# Patient Record
Sex: Male | Born: 1960
Health system: Southern US, Community
[De-identification: ages and names within clinical notes are randomized; demographics above are authoritative.]

## PROBLEM LIST (undated history)

## (undated) DIAGNOSIS — K469 Unspecified abdominal hernia without obstruction or gangrene: Secondary | ICD-10-CM

---

## 1998-05-10 ENCOUNTER — Ambulatory Visit (HOSPITAL_COMMUNITY): Admission: RE | Admit: 1998-05-10 | Discharge: 1998-05-10 | Payer: Self-pay | Admitting: Gastroenterology

## 2013-01-07 ENCOUNTER — Ambulatory Visit (INDEPENDENT_AMBULATORY_CARE_PROVIDER_SITE_OTHER): Payer: BC Managed Care – PPO | Admitting: Emergency Medicine

## 2013-01-07 VITALS — BP 128/64 | HR 77 | Temp 99.9°F | Resp 16 | Ht 69.0 in | Wt 170.0 lb

## 2013-01-07 DIAGNOSIS — J209 Acute bronchitis, unspecified: Secondary | ICD-10-CM

## 2013-01-07 DIAGNOSIS — J329 Chronic sinusitis, unspecified: Secondary | ICD-10-CM

## 2013-01-07 MED ORDER — ALBUTEROL SULFATE HFA 108 (90 BASE) MCG/ACT IN AERS: 2.0000 | INHALATION_SPRAY | RESPIRATORY_TRACT | Status: DC | PRN

## 2013-01-07 MED ORDER — AZITHROMYCIN 250 MG PO TABS: ORAL_TABLET | ORAL | Status: DC

## 2013-01-07 MED ORDER — HYDROCOD POLST-CHLORPHEN POLST 10-8 MG/5ML PO LQCR: 5.0000 mL | Freq: Two times a day (BID) | ORAL | Status: DC | PRN

## 2013-01-07 NOTE — Patient Instructions (Signed)
Metered Dose Inhaler (No Spacer Used) Inhaled medicines are the basis of asthma treatment and other breathing problems. Inhaled medicine can only be effective if used properly. Good technique assures that the medicine reaches the lungs. Metered dose inhalers (MDIs) are used to deliver a variety of inhaled medicines. These include quick relief medicines, controller medicines (such as corticosteroids), and cromolyn. The medicine is delivered by pushing down on a metal canister to release a set amount of spray.  If you are using different kinds of inhalers, use your quick relief medicine to open the airways 10 to 15 minutes before using a steroid. If you are unsure which inhalers to use and the order of using them, ask your caregiver, nurse, or respiratory therapist. HOW TO USE THE INHALER 1. Remove cap from inhaler. 2. Shake inhaler for 5 seconds before each inhalation (breathing in). 3. Position the inhaler so that the top of the canister faces up. 4. Put your index finger on the top of the medication canister. Your thumb supports the bottom of the inhaler. 5. Open your mouth. 6. Hold the inhaler 1 to 2 inches away from your open mouth. This allows the medicine to slow down before the medicine enters the mouth. 7. Exhale (breathe out) normally and as completely as possible. 8. Press the canister down with the index finger to release the medication. 9. At the same time as the canister is pressed, inhale deeply and slowly until the lungs are completely filled. This should take 4 to 6 seconds. Keep your tongue down. 10. Hold the medication in your lungs for up to 10 seconds (10 seconds is best). This helps the medicine get into the small airways of your lungs to work better. 11. Breathe out slowly, through pursed lips. Whistling is an example of pursed lips. 12. Wait at least 1 minute between puffs. Continue with the above steps until you have taken the number of puffs your caregiver has  ordered. 13. Replace cap on inhaler. AVOID:  Inhaling before or after starting the spray of medicine. It takes practice to coordinate your breathing with triggering the spray.  Inhaling through the nose (rather than the mouth) when triggering the spray. HOW TO DETERMINE IF YOUR INHALER IS FULL OR NEARLY EMPTY:  Determine when an inhaler is empty. You cannot know when an MDI canister is empty by shaking it. A few MDIs are now being made with dose counters. Ask your caregiver for a prescription that has a dose counter if you feel you need that extra help.  If your inhaler does not have a counter, check the number of doses in the inhaler before you use it. The canister or box will list the number of doses in the canister. Divide the total number of doses in the canister by the number you will use each day to find how many days the canister will last. (For example, if your canister has 200 doses and you take 2 puffs, 4 times each day, which is 8 puffs a day. Dividing 200 by 8 equals 25. The canister should last 25 days.) Using a calendar, count forward that many days to see when your inhaler will run out. Write the refill date on a calendar or your canister.  Remember, if you need to take extra doses, the inhaler will empty sooner than you figured. Be sure you have a refill before your canister runs out. Refill your inhaler 7 to 10 days before it runs out. HOME CARE INSTRUCTIONS   Do   not use the inhaler more than your caregiver tells you. If you are still wheezing and are feeling tightness in your chest, call your caregiver.  Keep an adequate supply of medication. This includes making sure the medicine is not expired, and you have a spare MDI.  Follow your caregiver or inhaler insert directions for cleaning the inhaler. SEEK MEDICAL CARE IF:   Symptoms are only partially relieved with your inhalers.  You are having trouble using your inhalers.  You experience some increase in phlegm.  You  develop a fever of 102 F (38.9 C). SEEK IMMEDIATE MEDICAL CARE IF:   You feel little or no relief with your inhalers. You are still wheezing and are feeling shortness of breath and/or tightness in your chest.  You have side effects such as dizziness, headaches, or fast heart rate.  You have chills, fever, night sweats or an oral temperature above 102 F (38.9 C) develops.  Phlegm production increases a lot, or there is blood in the phlegm. MAKE SURE YOU:   Understand these instructions.  Will watch your condition.  Will get help right away if you are not doing well or get worse. Document Released: 07/07/2007 Document Revised: 12/02/2011 Document Reviewed: 06/27/2009 Exit[employer] Patient Information 2013 [employer].  

## 2013-01-07 NOTE — Progress Notes (Signed)
[facility] [address] [place] [address] [phone]  Date:  01/07/2013   Name:  Edward Mcguire.   DOB:  [birth year]   MRN:  [id number]  PCP:  No PCP Per Patient    Chief Complaint: chest congestion   History of Present Illness:  Edward Mcguire. is a 52 y.o. very pleasant male patient who presents with the following:  Ill past several days with nasal congestion and post nasal drainage. Cough productive purulent sputum.  No fever or chills. No nausea or vomiting. No wheezing.  Feels short of breath in AM while coughing up sputum.  No improvement with over the counter medications or other home remedies. Denies other complaint or health concern today.   There is no problem list on file for this patient.   History reviewed. No pertinent past medical history.  History reviewed. No pertinent past surgical history.  History  Substance Use Topics  . Smoking status: Current Every Day Smoker -- 2.00 packs/day for 20 years  . Smokeless tobacco: Not on file  . Alcohol Use: No    History reviewed. No pertinent family history.  No Known Allergies  Medication list has been reviewed and updated.  No current outpatient prescriptions on file prior to visit.   No current facility-administered medications on file prior to visit.    Review of Systems:  As per HPI, otherwise negative.    Physical Examination: Filed Vitals:   01/07/13 1430  BP: 128/64  Pulse: 77  Temp: 99.9 F (37.7 C)  Resp: 16   Filed Vitals:   01/07/13 1430  Height: 5\' 9"  (1.753 m)  Weight: 170 lb (77.111 kg)   Body mass index is 25.09 kg/(m^2). Ideal Body Weight: Weight in (lb) to have BMI = 25: 168.9  GEN: WDWN, NAD, Non-toxic, A & O x 3 HEENT: Atraumatic, Normocephalic. Neck supple. No masses, No LAD. Ears and Nose: No external deformity. CV: RRR, No M/G/R. No JVD. No thrill. No extra heart sounds. PULM: CTA B, diffuse wheezes, no crackles, rhonchi. No  retractions. No resp. distress. No accessory muscle use. ABD: S, NT, ND, +BS. No rebound. No HSM. EXTR: No c/c/e NEURO Normal gait.  PSYCH: Normally interactive. Conversant. Not depressed or anxious appearing.  Calm demeanor.    Assessment and Plan: sinusits Bronchitis  Signed,  Phillips Odor, MD

## 2013-03-12 ENCOUNTER — Ambulatory Visit (INDEPENDENT_AMBULATORY_CARE_PROVIDER_SITE_OTHER): Payer: BC Managed Care – PPO | Admitting: Family Medicine

## 2013-03-12 ENCOUNTER — Emergency Department (HOSPITAL_COMMUNITY)
Admission: EM | Admit: 2013-03-12 | Discharge: 2013-03-12 | Disposition: A | Discharge status: Home / Self Care | Payer: BC Managed Care – PPO | Attending: Emergency Medicine | Admitting: Emergency Medicine

## 2013-03-12 ENCOUNTER — Emergency Department (HOSPITAL_COMMUNITY): Payer: BC Managed Care – PPO

## 2013-03-12 ENCOUNTER — Encounter (HOSPITAL_COMMUNITY): Payer: Self-pay | Admitting: *Deleted

## 2013-03-12 VITALS — BP 132/74 | HR 90 | Temp 98.7°F | Resp 18

## 2013-03-12 DIAGNOSIS — R079 Chest pain, unspecified: Secondary | ICD-10-CM

## 2013-03-12 DIAGNOSIS — Z720 Tobacco use: Secondary | ICD-10-CM | POA: Insufficient documentation

## 2013-03-12 DIAGNOSIS — F17200 Nicotine dependence, unspecified, uncomplicated: Secondary | ICD-10-CM | POA: Insufficient documentation

## 2013-03-12 DIAGNOSIS — R071 Chest pain on breathing: Secondary | ICD-10-CM | POA: Insufficient documentation

## 2013-03-12 DIAGNOSIS — Z833 Family history of diabetes mellitus: Secondary | ICD-10-CM

## 2013-03-12 DIAGNOSIS — Z8719 Personal history of other diseases of the digestive system: Secondary | ICD-10-CM | POA: Insufficient documentation

## 2013-03-12 HISTORY — DX: Unspecified abdominal hernia without obstruction or gangrene: K46.9

## 2013-03-12 LAB — POCT I-STAT TROPONIN I: Troponin i, poc: 0 ng/mL (ref 0.00–0.08)

## 2013-03-12 LAB — BASIC METABOLIC PANEL
Chloride: 102 mEq/L (ref 96–112)
GFR calc Af Amer: 85 mL/min — ABNORMAL LOW (ref >90)
Potassium: 3.2 mEq/L — ABNORMAL LOW (ref 3.5–5.1)

## 2013-03-12 LAB — CBC
HCT: 43.1 % (ref 39.0–52.0)
Platelets: 270 10*3/uL (ref 150–400)
RDW: 12.4 % (ref 11.5–15.5)
WBC: 9.9 10*3/uL (ref 4.0–10.5)

## 2013-03-12 LAB — TROPONIN I: Troponin I: <0.3 ng/mL (ref <0.30)

## 2013-03-12 NOTE — ED Provider Notes (Signed)
History     CSN: [id number]  Arrival date & time 03/12/13  1205   First MD Initiated Contact with Patient 03/12/13 1213      Chief Complaint  Patient presents with  . Chest Pain    (Consider location/radiation/quality/duration/timing/severity/associated sxs/prior treatment) HPI Patient presents with concern of right-sided chest pain.  He states the past weeks he has had similar episodes, occurring without clear precipitant, typically lasting several minutes.  Episodes are characterized by discomfort in the right side with any concurrent fluttering sensation and left arm dysesthesia.  There is mild associated lightheadedness and nausea, but no vomiting, no syncope. There is no concurrent dyspnea, fever. On exam the patient states the symptoms are essentially gone, though there is mild tingling sensation in the right chest.   Past Medical History  Diagnosis Date  . Hernia     History reviewed. No pertinent past surgical history.  History reviewed. No pertinent family history.  History  Substance Use Topics  . Smoking status: Current Every Day Smoker -- 2.00 packs/day for 20 years  . Smokeless tobacco: Not on file  . Alcohol Use: No      Review of Systems  Constitutional:       Per HPI, otherwise negative  HENT:       Per HPI, otherwise negative  Respiratory:       Per HPI, otherwise negative  Cardiovascular:       Per HPI, otherwise negative  Gastrointestinal: Negative for vomiting.  Endocrine:       Negative aside from HPI  Genitourinary:       Neg aside from HPI   Musculoskeletal:       Per HPI, otherwise negative  Skin: Negative.   Neurological: Negative for syncope.    Allergies  Review of patient's allergies indicates no known allergies.  Home Medications   Current Outpatient Rx  Name  Route  Sig  Dispense  Refill  . aspirin 325 MG tablet   Oral   Take 650 mg by mouth once.         . Multiple Vitamin (MULTIVITAMIN) tablet   Oral   Take 1  tablet by mouth daily.         Marland Kitchen albuterol (PROVENTIL HFA;VENTOLIN HFA) 108 (90 BASE) MCG/ACT inhaler   Inhalation   Inhale 2 puffs into the lungs every 4 (four) hours as needed for wheezing (cough, shortness of breath or wheezing.).   1 Inhaler   1     BP 136/87  Pulse 85  Temp(Src) 98.4 F (36.9 C) (Oral)  Resp 18  SpO2 96%  Physical Exam  Nursing note and vitals reviewed. Constitutional: He is oriented to person, place, and time. He appears well-developed. No distress.  HENT:  Head: Normocephalic and atraumatic.  Eyes: Conjunctivae and EOM are normal.  Cardiovascular: Normal rate and regular rhythm.   Pulmonary/Chest: Effort normal. No stridor. No respiratory distress.  Abdominal: He exhibits no distension.  Musculoskeletal: He exhibits no edema.  Neurological: He is alert and oriented to person, place, and time.  Skin: Skin is warm and dry.  Psychiatric: He has a normal mood and affect.    ED Course  Procedures (including critical care time)  Labs Reviewed  CBC  BASIC METABOLIC PANEL   Dg Chest 2 View  03/12/2013   *RADIOLOGY REPORT*  Clinical Data: Left-sided chest pain, occasional shortness of breath, smoker  CHEST - 2 VIEW  Comparison: None.  Findings: Normal cardiac silhouette and mediastinal contours.  No focal parenchymal opacities.  No pleural effusion or pneumothorax. No definite evidence of edema.  No acute osseous abnormalities.  IMPRESSION: No acute cardiopulmonary disease.   Original Report Authenticated By: Tacey Ruiz, MD     No diagnosis found.  Cardiac 85 sinus rhythm normal Pulse ox 100% room air normal    Date: 03/12/2013  Rate: 87  Rhythm: normal sinus rhythm  QRS Axis: normal  Intervals: normal  ST/T Wave abnormalities: normal  Conduction Disutrbances:nonspecific intraventricular conduction delay  Narrative Interpretation:   Old EKG Reviewed: none available borderline  4:17 PM Second troponin is negative.  I discussed all  results with the patient and his son.  He remains anemic with stable, in no distress. MDM  This patient presents with concern of episodic right sided chest pain.  On exam he is a widower, hemodynamic stable.  The patient has had pain for some time, there is low suspicion for ACS given the serial troponins which were negative, the absence of any significant discomfort, is nonischemic ECG. I discussed the need to follow up with either his primary care physician Ray cardiologist for completion of his cardiac evaluation, though his risk profile is generally low, only increased by his smoking history. After discussion on return precautions, he was discharged in stable condition.        Gerhard Munch, MD 03/12/13 [phone]

## 2013-03-12 NOTE — ED Notes (Signed)
Pt reports having intermittent episodes of right side chest pains, went to an ucc today due to increase in cp this am, also has tingling to left arm and mild nausea. No acute distress noted at triage, ekg done.

## 2013-03-12 NOTE — Progress Notes (Signed)
[facility] [address], [place] [address] [phone]  Date:  03/12/2013   Name:  Edward Mcguire.   DOB:  Dec 29, 1960   MRN:  [id number]  PCP:  No PCP Per Patient    Chief Complaint: Chest Pain, Shortness of Breath, Numbness and Shaking   History of Present Illness:  Edward Mcguire. is a 52 y.o. very pleasant male patient who presents with the following:  He woke up around 0700 this morning and noted that he felt shaky, he was having chest pain/ tightness in the right side of his chest, and he felt "a little bit" short of breath.  He also felt some numbness in his left hand and arm- this is better, but he still notes "a little bit of tingling" in the left hand.  His CP was more severe for about 2 hours- it is now better but not 100% gone. He states he may get CP like this every few weeks, but it does not follow a pattern and does not seem to be exertional.    Burgess Estelle was a normal day for him- he works in an [employer] and did work hard yesterday in the heat. He was in his normal state of health.   He smokes about 2 PPD, but does not drink alcohol.  He does not get regular medical care and has not had his cholesterol checked recently.   He has a left inguinal hernia, but is not aware of any other health problems.  No known heart problem- however, his mother, GM, and paternal uncle all died of MI.  His uncle died at age 30, his mother in her 20's.   There is also a family history of DM  He did take aspirin this am- 2 of the 325 tablets.    He has sweet tea this am, but has not eaten food yet.    There are no active problems to display for this patient.   No past medical history on file.  No past surgical history on file.  History  Substance Use Topics  . Smoking status: Current Every Day Smoker -- 2.00 packs/day for 20 years  . Smokeless tobacco: Not on file  . Alcohol Use: No    No family history on file.  No Known  Allergies  Medication list has been reviewed and updated.  Current Outpatient Prescriptions on File Prior to Visit  Medication Sig Dispense Refill  . albuterol (PROVENTIL HFA;VENTOLIN HFA) 108 (90 BASE) MCG/ACT inhaler Inhale 2 puffs into the lungs every 4 (four) hours as needed for wheezing (cough, shortness of breath or wheezing.).  1 Inhaler  1  . azithromycin (ZITHROMAX) 250 MG tablet Take 2 tabs PO x 1 dose, then 1 tab PO QD x 4 days  6 tablet  0  . chlorpheniramine-HYDROcodone (TUSSIONEX PENNKINETIC ER) 10-8 MG/5ML LQCR Take 5 mLs by mouth every 12 (twelve) hours as needed.  60 mL  0   No current facility-administered medications on file prior to visit.    Review of Systems:  As per HPI- otherwise negative.   Physical Examination: Filed Vitals:   03/12/13 0950  BP: 132/74  Pulse: 90  Temp: 98.7 F (37.1 C)  Resp: 18   There were no vitals filed for this visit. There is no weight on file to calculate BMI. Ideal Body Weight:    GEN: WDWN, NAD, Non-toxic, A & O x 3, looks well HEENT: Atraumatic, Normocephalic.  Neck supple. No masses, No LAD.  Bilateral TM wnl, oropharynx normal.  PEERL,EOMI.   Ears and Nose: No external deformity. CV: RRR, No M/G/R. No JVD. No thrill. No extra heart sounds. PULM: CTA B, no wheezes, crackles, rhonchi. No retractions. No resp. distress. No accessory muscle use. ABD: S, NT, ND, +BS. No rebound. No HSM. EXTR: No c/c/e NEURO Normal gait. Normal strength, sensation, and DTR all extremities.  Normal Romberg  PSYCH: Normally interactive. Conversant. Not depressed or anxious appearing.  Calm demeanor.   EKG: NSR, no acute ST elevation or depression.    Results for orders placed in visit on 03/12/13  GLUCOSE, POCT (MANUAL RESULT ENTRY)      Result Value Range   POC Glucose 82  70 - 99 mg/dl     Assessment and Plan: Chest pain, unspecified  Family history of diabetes mellitus (DM) - Plan: POCT glucose (manual entry)  Jihan is here  today with CP and SOB, and left sided tingling this morning.  His sx are now a lot better, but not 100% resolved. He has cardiovascular risk factors including tobacco abuse and positive family history.  Counseled him that his symptoms could represent angina, ACS or a CVA/ TIA.  Advised him that we should send him to the hospital via EMS for a chest pain rule- out and possible head CT to evaluate for possible CVA.  However, at this time he declines EMS transport.  He will have his wife take him to the hospital.  Advised him that I cannot force him to use EMS, but I still recommend hat he allow him to use EMS transport.    He is given a paper copy of his EKG- we will need to scan his EKG into Epic.    Signed Abbe Amsterdam, MD

## 2013-03-13 ENCOUNTER — Emergency Department (HOSPITAL_COMMUNITY)
Admission: EM | Admit: 2013-03-13 | Discharge: 2013-03-13 | Disposition: A | Discharge status: Home / Self Care | Payer: BC Managed Care – PPO | Attending: Emergency Medicine | Admitting: Emergency Medicine

## 2013-03-13 ENCOUNTER — Emergency Department (HOSPITAL_COMMUNITY): Payer: BC Managed Care – PPO

## 2013-03-13 ENCOUNTER — Encounter (HOSPITAL_COMMUNITY): Payer: Self-pay | Admitting: Emergency Medicine

## 2013-03-13 DIAGNOSIS — F17200 Nicotine dependence, unspecified, uncomplicated: Secondary | ICD-10-CM | POA: Insufficient documentation

## 2013-03-13 DIAGNOSIS — Z79899 Other long term (current) drug therapy: Secondary | ICD-10-CM | POA: Insufficient documentation

## 2013-03-13 DIAGNOSIS — K4090 Unilateral inguinal hernia, without obstruction or gangrene, not specified as recurrent: Secondary | ICD-10-CM | POA: Insufficient documentation

## 2013-03-13 DIAGNOSIS — Z7982 Long term (current) use of aspirin: Secondary | ICD-10-CM | POA: Insufficient documentation

## 2013-03-13 DIAGNOSIS — R110 Nausea: Secondary | ICD-10-CM | POA: Insufficient documentation

## 2013-03-13 DIAGNOSIS — K469 Unspecified abdominal hernia without obstruction or gangrene: Secondary | ICD-10-CM

## 2013-03-13 LAB — COMPREHENSIVE METABOLIC PANEL
ALT: 16 U/L (ref 0–53)
AST: 17 U/L (ref 0–37)
Albumin: 3.9 g/dL (ref 3.5–5.2)
CO2: 25 mEq/L (ref 19–32)
Calcium: 9.1 mg/dL (ref 8.4–10.5)
Chloride: 104 mEq/L (ref 96–112)
GFR calc non Af Amer: 76 mL/min — ABNORMAL LOW (ref >90)
Sodium: 139 mEq/L (ref 135–145)
Total Bilirubin: 0.5 mg/dL (ref 0.3–1.2)

## 2013-03-13 LAB — CBC WITH DIFFERENTIAL/PLATELET
Basophils Absolute: 0 10*3/uL (ref 0.0–0.1)
Lymphocytes Relative: 18 % (ref 12–46)
Neutro Abs: 6.9 10*3/uL (ref 1.7–7.7)
Platelets: 230 10*3/uL (ref 150–400)
RDW: 12.5 % (ref 11.5–15.5)
WBC: 9.3 10*3/uL (ref 4.0–10.5)

## 2013-03-13 NOTE — ED Notes (Signed)
Pt reports last night while eating began gagging. Pt c/o abdominal pain onset last night. Pt was seen here yesterday, "But that was for my chest." "I think it is my hernia."

## 2013-03-13 NOTE — ED Provider Notes (Signed)
History     CSN: [id number]  Arrival date & time 03/13/13  1004   First MD Initiated Contact with Patient 03/13/13 1008      Chief Complaint  Patient presents with  . Abdominal Pain    (Consider location/radiation/quality/duration/timing/severity/associated sxs/prior treatment) The history is provided by the patient.  Edward Dorce. is a 52 y.o. male history of left inguinal hernia, who presented with nausea and abdominal pain, and constipation.  He came here yesterday for chest pain, and had trop neg x 2 and unremarkable labs.  He went home yesterday, and ate some food and had some nausea, and began gagging but didn't vomit. Also had more pain at hernia site. He states that his hernia sometimes pops out but he is able to reduce it. He was constipated but had an episode of diarrhea last night. Denies fevers. Denies chest pain today.    Past Medical History  Diagnosis Date  . Hernia     History reviewed. No pertinent past surgical history.  No family history on file.  History  Substance Use Topics  . Smoking status: Current Every Day Smoker -- 2.00 packs/day for 20 years  . Smokeless tobacco: Not on file  . Alcohol Use: No      Review of Systems  Gastrointestinal: Positive for nausea and abdominal pain.  All other systems reviewed and are negative.    Allergies  Review of patient's allergies indicates no known allergies.  Home Medications   Current Outpatient Rx  Name  Route  Sig  Dispense  Refill  . acetaminophen (TYLENOL) 325 MG tablet   Oral   Take 650 mg by mouth every 6 (six) hours as needed for pain.         Marland Kitchen albuterol (PROVENTIL HFA;VENTOLIN HFA) 108 (90 BASE) MCG/ACT inhaler   Inhalation   Inhale 2 puffs into the lungs every 4 (four) hours as needed for wheezing (cough, shortness of breath or wheezing.).   1 Inhaler   1   . aspirin 325 MG tablet   Oral   Take 650 mg by mouth once.         . Multiple Vitamin (MULTIVITAMIN) tablet    Oral   Take 1 tablet by mouth daily.           BP 117/82  Pulse 75  Temp(Src) 98.4 F (36.9 C) (Oral)  Resp 16  SpO2 95%  Physical Exam  Nursing note and vitals reviewed. Constitutional: He is oriented to person, place, and time. He appears well-developed and well-nourished.  NAD   HENT:  Head: Normocephalic.  Mouth/Throat: Oropharynx is clear and moist.  Eyes: Conjunctivae are normal. Pupils are equal, round, and reactive to light.  Neck: Normal range of motion. Neck supple.  Cardiovascular: Normal rate, regular rhythm and normal heart sounds.   Pulmonary/Chest: Effort normal and breath sounds normal. No respiratory distress. He has no wheezes.  Abdominal: Soft.  L indirect inguinal hernia, reducible. Mild LLQ tenderness at hernia site. Testicles nontender.   Musculoskeletal: Normal range of motion.  Neurological: He is alert and oriented to person, place, and time.  Skin: Skin is warm and dry.  Psychiatric: He has a normal mood and affect. His behavior is normal. Judgment and thought content normal.    ED Course  Procedures (including critical care time)  Labs Reviewed  CBC WITH DIFFERENTIAL - Abnormal; Notable for the following:    MCHC 36.9 (*)    All other components within normal limits  COMPREHENSIVE METABOLIC PANEL - Abnormal; Notable for the following:    Potassium 3.4 (*)    Glucose, Bld 133 (*)    GFR calc non Af Amer 76 (*)    GFR calc Af Amer 88 (*)    All other components within normal limits  LIPASE, BLOOD  CG4 I-STAT (LACTIC ACID)   Dg Chest 2 View  03/12/2013   *RADIOLOGY REPORT*  Clinical Data: Left-sided chest pain, occasional shortness of breath, smoker  CHEST - 2 VIEW  Comparison: None.  Findings: Normal cardiac silhouette and mediastinal contours.  No focal parenchymal opacities.  No pleural effusion or pneumothorax. No definite evidence of edema.  No acute osseous abnormalities.  IMPRESSION: No acute cardiopulmonary disease.   Original Report  Authenticated By: Tacey Ruiz, MD   Dg Abd 2 Views  03/13/2013   *RADIOLOGY REPORT*  Clinical Data: Abdominal pain and nausea.  ABDOMEN - 2 VIEW  Comparison: None.  Findings: Abdominal bowel gas pattern is within normal limits. There is no evidence of bowel obstruction or ileus.  No evidence of free intraperitoneal air.  No soft tissue abnormalities or abnormal calcifications are seen.  Visualized bony structures are unremarkable.  IMPRESSION: Normal abdominal films.   Original Report Authenticated By: Irish Lack, M.D.     No diagnosis found.    MDM  Edward Qualia. is a 52 y.o. male here with abdominal pain, nausea. Pain likely from inguinal hernia, which doesn't appear incarcerated. Will get xray to r/o obstruction and basic lab work and lactate. Will give him surgery f/u.    11:47 AM Labs and lactate normal. Xray showed no SBO. Will d/c home with surgery f/u. Recommend no heavy lifting.       Edward Canal, MD 03/13/13 [phone]

## 2013-03-22 ENCOUNTER — Encounter: Payer: Self-pay | Admitting: Family Medicine

## 2013-03-23 HISTORY — PX: HERNIA REPAIR: SHX51

## 2013-06-06 ENCOUNTER — Ambulatory Visit (INDEPENDENT_AMBULATORY_CARE_PROVIDER_SITE_OTHER): Payer: BC Managed Care – PPO | Admitting: Family Medicine

## 2013-06-06 VITALS — BP 107/69 | HR 67 | Temp 99.0°F | Resp 17 | Wt 161.0 lb

## 2013-06-06 DIAGNOSIS — J209 Acute bronchitis, unspecified: Secondary | ICD-10-CM

## 2013-06-06 DIAGNOSIS — J329 Chronic sinusitis, unspecified: Secondary | ICD-10-CM

## 2013-06-06 MED ORDER — ALBUTEROL SULFATE HFA 108 (90 BASE) MCG/ACT IN AERS: 2.0000 | INHALATION_SPRAY | RESPIRATORY_TRACT | Status: DC | PRN

## 2013-06-06 MED ORDER — AZITHROMYCIN 250 MG PO TABS: ORAL_TABLET | ORAL | Status: DC

## 2013-06-06 MED ORDER — PREDNISONE 20 MG PO TABS: 40.0000 mg | ORAL_TABLET | Freq: Every day | ORAL | Status: DC

## 2013-06-06 NOTE — Patient Instructions (Addendum)

## 2013-06-06 NOTE — Progress Notes (Signed)
  Subjective:    Patient ID: Edward Qualia., male    DOB: July 20, 1961, 52 y.o.   MRN: [id number]  HPI  Patient with complaints of fever, nasal congestion. States getting worse, began 2 weeks ago, heavy smoker, 2ppd. States getting worse. Nasal drainage now thick, green in color. Has a cough, states wheezing since yesterday. 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  Review of Systems Low-grade fever, green phlegm    Objective:   Physical Exam Patient seated comfortably on exam table.  HEENT: Unremarkable except for swollen nasal passages with mucopurulent discharge. No epistaxis. Chest clear Heart: Regular no murmur Extremities: No edema Skin: Clear of rashes, patient has a an abrasion appearing area of his right cheek    Assessment & Plan:  Sinusitis Bronchitis Meds ordered this encounter  Medications  . predniSONE (DELTASONE) 20 MG tablet    Sig: Take 2 tablets (40 mg total) by mouth daily.    Dispense:  10 tablet    Refill:  0  . azithromycin (ZITHROMAX) 250 MG tablet    Sig: Take 2 tabs PO x 1 dose, then 1 tab PO QD x 4 days    Dispense:  6 tablet    Refill:  0

## 2013-07-29 ENCOUNTER — Other Ambulatory Visit: Payer: Self-pay

## 2014-01-30 ENCOUNTER — Ambulatory Visit (INDEPENDENT_AMBULATORY_CARE_PROVIDER_SITE_OTHER): Payer: BC Managed Care – PPO | Admitting: Family Medicine

## 2014-01-30 VITALS — BP 134/82 | HR 68 | Temp 98.0°F | Resp 16 | Ht 68.0 in | Wt 162.2 lb

## 2014-01-30 DIAGNOSIS — S29019A Strain of muscle and tendon of unspecified wall of thorax, initial encounter: Secondary | ICD-10-CM

## 2014-01-30 DIAGNOSIS — S239XXA Sprain of unspecified parts of thorax, initial encounter: Secondary | ICD-10-CM

## 2014-01-30 DIAGNOSIS — M546 Pain in thoracic spine: Secondary | ICD-10-CM

## 2014-01-30 MED ORDER — METHOCARBAMOL 500 MG PO TABS: 500.0000 mg | ORAL_TABLET | Freq: Three times a day (TID) | ORAL | Status: DC | PRN

## 2014-01-30 MED ORDER — IBUPROFEN 800 MG PO TABS: 800.0000 mg | ORAL_TABLET | Freq: Three times a day (TID) | ORAL | Status: DC | PRN

## 2014-01-30 NOTE — Patient Instructions (Signed)
Thoracic Strain °You have injured the muscles or tendons that attach to the upper part of your back behind your chest. This injury is called a thoracic strain, thoracic sprain, or mid-back strain.  °CAUSES  °The cause of thoracic strain varies. A less severe injury involves pulling a muscle or tendon without tearing it. A more severe injury involves tearing (rupturing) a muscle or tendon. With less severe injuries, there may be little loss of strength. Sometimes, there are breaks (fractures) in the bones to which the muscles are attached. These fractures are rare, unless there was a direct hit (trauma) or you have weak bones due to osteoporosis or age. Longstanding strains may be caused by overuse or improper form during certain movements. Obesity can also increase your risk for back injuries. Sudden strains may occur due to injury or not warming up properly before exercise. Often, there is no obvious cause for a thoracic strain. °SYMPTOMS  °The main symptom is pain, especially with movement, such as during exercise. °DIAGNOSIS  °Your caregiver can usually tell what is wrong by taking an X-ray and doing a physical exam. °TREATMENT  °· Physical therapy may be helpful for recovery. Your caregiver can give you exercises to do or refer you to a physical therapist after your pain improves. °· After your pain improves, strengthening and conditioning programs appropriate for your sport or occupation may be helpful. °· Always warm up before physical activities or athletics. Stretching after physical activity may also help. °· Certain over-the-counter medicines may also help. Ask your caregiver if there are medicines that would help you. °If this is your first thoracic strain injury, proper care and proper healing time before starting activities should prevent long-term problems. Torn ligaments and tendons require as long to heal as broken bones. Average healing times may be only 1 week for a mild strain. For torn muscles  and tendons, healing time may be up to 6 weeks to 2 months. °HOME CARE INSTRUCTIONS  °· Apply ice to the injured area. Ice massages may also be used as directed. °· Put ice in a plastic bag. °· Place a towel between your skin and the bag. °· Leave the ice on for 15-20 minutes, 03-04 times a day, for the first 2 days. °· Only take over-the-counter or prescription medicines for pain, discomfort, or fever as directed by your caregiver. °· Keep your appointments for physical therapy if this was prescribed. °· Use wraps and back braces as instructed. °SEEK IMMEDIATE MEDICAL CARE IF:  °· You have an increase in bruising, swelling, or pain. °· Your pain has not improved with medicines. °· You develop new shortness of breath, chest pain, or fever. °· Problems seem to be getting worse rather than better. °MAKE SURE YOU:  °· Understand these instructions. °· Will watch your condition. °· Will get help right away if you are not doing well or get worse. °Document Released: 11/30/2003 Document Revised: 12/02/2011 Document Reviewed: 10/26/2010 °ExitCare® Patient Information ©2014 [employer]. ° °

## 2014-01-30 NOTE — Progress Notes (Signed)
Subjective:    Patient ID: Edward QualiaAlbert R Piccini Jr., male    DOB: 31-Oct-1960, 53 y.o.   MRN: [id number]  01/30/2014  Back Pain   HPI This 53 y.o. male presents for evaluation of low back pain.  Stretching against golf cart today with acute onset of upper back pain.  No radiation into arms or legs.  No n/t.w.  No medication.  Mild pain with deep breathing.    No b/b dysfunction; no saddle paresthesias.  No history of lower back pain.   Review of Systems  Constitutional: Negative for fever, chills, diaphoresis and fatigue.  Genitourinary: Negative for dysuria, urgency, hematuria, difficulty urinating and testicular pain.  Musculoskeletal: Positive for myalgias and back pain. Negative for neck pain and neck stiffness.  Skin: Negative for rash.  Neurological: Negative for weakness and numbness.    Past Medical History  Diagnosis Date  . Hernia    No Known Allergies Current Outpatient Prescriptions  Medication Sig Dispense Refill  . ibuprofen (ADVIL,MOTRIN) 800 MG tablet Take 1 tablet (800 mg total) by mouth every 8 (eight) hours as needed for moderate pain. 30 tablet 0  . methocarbamol (ROBAXIN) 500 MG tablet Take 1-2 tablets (500-1,000 mg total) by mouth every 8 (eight) hours as needed for muscle spasms. 40 tablet 0  . oxyCODONE-acetaminophen (PERCOCET/ROXICET) 5-325 MG per tablet Take 1-2 tablets by mouth every 4 (four) hours as needed for severe pain (for pain). 15 tablet 0   No current facility-administered medications for this visit.       Objective:    BP 134/82 mmHg  Pulse 68  Temp(Src) 98 F (36.7 C) (Oral)  Resp 16  Ht 5\' 8"  (1.727 m)  Wt 162 lb 3.2 oz (73.573 kg)  BMI 24.67 kg/m2  SpO2 95% Physical Exam  Constitutional: He is oriented to person, place, and time. He appears well-developed and well-nourished. No distress.  HENT:  Head: Normocephalic and atraumatic.  Eyes: Conjunctivae and EOM are normal. Pupils are equal, round, and reactive to light.  Neck: Normal  range of motion. Neck supple. Carotid bruit is not present. No thyromegaly present.  Cardiovascular: Normal rate, regular rhythm, normal heart sounds and intact distal pulses.  Exam reveals no gallop and no friction rub.   No murmur heard. Pulmonary/Chest: Effort normal and breath sounds normal. He has no wheezes. He has no rales.  Musculoskeletal:       Right shoulder: Normal.       Left shoulder: Normal.       Cervical back: Normal.       Thoracic back: He exhibits decreased range of motion, tenderness, pain and spasm. He exhibits no bony tenderness.       Lumbar back: He exhibits decreased range of motion, tenderness, pain and spasm. He exhibits no bony tenderness and normal pulse.  Lumbar spine: decreased ROM throughout; toe and heel walking intact; marching intact; straight leg raises negative; motor 5/5 BLE.  Lymphadenopathy:    He has no cervical adenopathy.  Neurological: He is alert and oriented to person, place, and time. No cranial nerve deficit.  Skin: Skin is warm and dry. No rash noted. He is not diaphoretic.  Psychiatric: He has a normal mood and affect. His behavior is normal.  Nursing note and vitals reviewed.       Assessment & Plan:  Pain in thoracic spine  Strain of thoracic region   1. Pain/Strain Thoracic Spine:  New. Rx for Ibuprofen 800mg  tid, Robaxin tid PRN.  Recommend  stretching, frequent ambulation, heat to area.  RTC if no improvement in two weeks for xrays and/or call for PT referral.   Meds ordered this encounter  Medications  . ibuprofen (ADVIL,MOTRIN) 800 MG tablet    Sig: Take 1 tablet (800 mg total) by mouth every 8 (eight) hours as needed for moderate pain.    Dispense:  30 tablet    Refill:  0  . methocarbamol (ROBAXIN) 500 MG tablet    Sig: Take 1-2 tablets (500-1,000 mg total) by mouth every 8 (eight) hours as needed for muscle spasms.    Dispense:  40 tablet    Refill:  0    No Follow-up on file.    Nilda SimmerKristi Tresha Muzio, M.D.  [facility]  [place] [address] [place], [place]  [id number] [phone] phone [phone] fax

## 2014-05-30 ENCOUNTER — Encounter (HOSPITAL_BASED_OUTPATIENT_CLINIC_OR_DEPARTMENT_OTHER): Payer: Self-pay | Admitting: Emergency Medicine

## 2014-05-30 ENCOUNTER — Emergency Department (HOSPITAL_BASED_OUTPATIENT_CLINIC_OR_DEPARTMENT_OTHER)
Admission: EM | Admit: 2014-05-30 | Discharge: 2014-05-30 | Disposition: A | Discharge status: Home / Self Care | Payer: BC Managed Care – PPO | Attending: Emergency Medicine | Admitting: Emergency Medicine

## 2014-05-30 DIAGNOSIS — Z791 Long term (current) use of non-steroidal anti-inflammatories (NSAID): Secondary | ICD-10-CM | POA: Diagnosis not present

## 2014-05-30 DIAGNOSIS — H16139 Photokeratitis, unspecified eye: Secondary | ICD-10-CM | POA: Diagnosis not present

## 2014-05-30 DIAGNOSIS — Y9389 Activity, other specified: Secondary | ICD-10-CM | POA: Diagnosis not present

## 2014-05-30 DIAGNOSIS — Y929 Unspecified place or not applicable: Secondary | ICD-10-CM | POA: Diagnosis not present

## 2014-05-30 DIAGNOSIS — X58XXXA Exposure to other specified factors, initial encounter: Secondary | ICD-10-CM | POA: Insufficient documentation

## 2014-05-30 DIAGNOSIS — S0510XA Contusion of eyeball and orbital tissues, unspecified eye, initial encounter: Secondary | ICD-10-CM | POA: Insufficient documentation

## 2014-05-30 DIAGNOSIS — F17200 Nicotine dependence, unspecified, uncomplicated: Secondary | ICD-10-CM | POA: Diagnosis not present

## 2014-05-30 DIAGNOSIS — Z8719 Personal history of other diseases of the digestive system: Secondary | ICD-10-CM | POA: Diagnosis not present

## 2014-05-30 DIAGNOSIS — H16133 Photokeratitis, bilateral: Secondary | ICD-10-CM

## 2014-05-30 MED ORDER — TETRACAINE HCL 0.5 % OP SOLN
OPHTHALMIC | Status: AC
  Filled 2014-05-30: qty 2

## 2014-05-30 MED ORDER — ERYTHROMYCIN 5 MG/GM OP OINT
TOPICAL_OINTMENT | Freq: Four times a day (QID) | OPHTHALMIC | Status: DC
  Administered 2014-05-30: 02:00:00 via OPHTHALMIC
  Filled 2014-05-30: qty 3.5

## 2014-05-30 MED ORDER — TETRACAINE HCL 0.5 % OP SOLN: 2.0000 [drp] | Freq: Once | OPHTHALMIC | Status: DC

## 2014-05-30 MED ORDER — OXYCODONE-ACETAMINOPHEN 5-325 MG PO TABS: 1.0000 | ORAL_TABLET | ORAL | Status: DC | PRN

## 2014-05-30 MED ORDER — FLUORESCEIN SODIUM 1 MG OP STRP
1.0000 | ORAL_STRIP | Freq: Once | OPHTHALMIC | Status: DC
Start: 2014-05-30 — End: 2014-05-30
  Filled 2014-05-30: qty 1

## 2014-05-30 MED ORDER — OXYCODONE-ACETAMINOPHEN 5-325 MG PO TABS
2.0000 | ORAL_TABLET | Freq: Once | ORAL | Status: AC
  Administered 2014-05-30: 2 via ORAL
  Filled 2014-05-30: qty 2

## 2014-05-30 NOTE — Discharge Instructions (Signed)
Ultraviolet Keratitis  Ultraviolet keratitis can occur when too much UV light enters the cornea. The cornea is the clear cover on the front part of your eye that helps focus light. It protects your eyes from dust and other foreign bodies and filters ultraviolet rays. This condition can be caused by exposure to snow (snow blindness) from the reflected or direct sunlight. It may also be caused by exposure to welding arcs or halogen lamps (flash burn) and prolonged exposure to direct sunlight. Brief exposure can result in severe problems several hours later.  CAUSES   Causes of ultraviolet keratitis include:   Exposure to snow (snow blindness) from the reflected or direct sunlight.   Exposure to welding arcs or halogen lamps (flash burn).   Prolonged exposure to direct sunlight.  SYMPTOMS   The symptoms of ultraviolet keratitis usually start 6 to 12 hours after the damage occurred. They may include the following:    Tearing.   Light sensitivity.   Gritty sensation in eyes.   Swelling of your eyelids.   Severe pain.  In spite of the pain, this condition will usually improve within 24 hours even without treatment.  HOME CARE INSTRUCTIONS    Apply cold packs on your eyes to ease the pain.   Only take over-the-counter or prescription medicines for pain, discomfort, or fever as directed by your caregiver.   Your caregiver may also prescribe an antibiotic ointment to help prevent infection and/or additional medications for pain relief.   Apply an eye patch to help relieve discomfort and assist in healing. If your caregiver patches your eyes, it is important to leave these patches on.   Follow the instructions given to you by your caregiver.   Do not rub your eyes.   If your caregiver has given you a follow-up appointment, it is very important to keep that appointment. Not keeping the appointment could result in a severe eye infection or permanent loss of vision. If there is any problem keeping the appointment,  you must call back to this facility for assistance.  SEEK IMMEDIATE MEDICAL CARE IF:    Pain is severe and not relieved by medication.   Pain or problems with vision last more than 48 hours.   Exposure to light is unavoidable and you need extra protection for your eyes.  MAKE SURE YOU:    Understand these instructions.   Will watch your condition.   Will get help right away if you are not doing well or get worse.  Document Released: 09/09/2005 Document Revised: 01/24/2014 Document Reviewed: 04/15/2008  ExitCare Patient Information 2015 [employer]. This information is not intended to replace advice given to you by your health care provider. Make sure you discuss any questions you have with your health care provider.

## 2014-05-30 NOTE — ED Notes (Signed)
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 Pts eyes exposed while welding. Pt can see but has intense pain

## 2014-05-30 NOTE — ED Provider Notes (Signed)
CSN: [id number]     Arrival date & time 05/30/14  0040 History   First MD Initiated Contact with Patient 05/30/14 0141     Chief Complaint  Patient presents with  . 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 His visor broke and his eyes were exposed to the welding arc for about 5 minutes. He is subsequently developed severe pain in his eyes associated with redness and blurred vision. 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 He has had no relief with over-the-counter eyedrops.  Past Medical History  Diagnosis Date  . Hernia    Past Surgical History  Procedure Laterality Date  . Hernia repair  03/2013   No family history on file. History  Substance Use Topics  . Smoking status: Current Every Day Smoker -- 2.00 packs/day for 20 years  . Smokeless tobacco: Not on file  . Alcohol Use: No    Review of Systems  All other systems reviewed and are negative.   Allergies  Review of patient's allergies indicates no known allergies.  Home Medications   Prior to Admission medications   Medication Sig Start Date End Date Taking? Authorizing Provider  ibuprofen (ADVIL,MOTRIN) 800 MG tablet Take 1 tablet (800 mg total) by mouth every 8 (eight) hours as needed for moderate pain. 01/30/14   Ethelda Chick, MD  methocarbamol (ROBAXIN) 500 MG tablet Take 1-2 tablets (500-1,000 mg total) by mouth every 8 (eight) hours as needed for muscle spasms. 01/30/14   Ethelda Chick, MD   BP 136/87  Pulse 60  Temp(Src) 98.1 F (36.7 C) (Oral)  Resp 18  Ht  (1.753 m)  Wt 165 lb (74.844 kg)  BMI 24.36 kg/m2  SpO2 98%  Physical Exam General: Well-developed, well-nourished male in no acute distress; appearance consistent with age of record HENT: normocephalic; atraumatic Eyes: pupils equal, round and reactive to light; extraocular  muscles intact; bilateral conjunctival injection; no fluorescein uptake Neck: supple Heart: regular rate and rhythm Lungs: Normal respiratory effort and excursion Abdomen: soft; nondistended Extremities: No deformity; full range of motion; pulses normal Neurologic: Awake, alert and oriented; motor function intact in all extremities and symmetric; no facial droop Skin: Warm and dry Psychiatric: Normal mood and affect     ED Course  Procedures (including critical care time)  MDM    Hanley Seamen, MD 05/30/14 0150

## 2015-07-03 ENCOUNTER — Ambulatory Visit (INDEPENDENT_AMBULATORY_CARE_PROVIDER_SITE_OTHER): Payer: BLUE CROSS/BLUE SHIELD | Admitting: Family Medicine

## 2015-07-03 VITALS — BP 134/82 | HR 85 | Temp 98.9°F | Resp 17 | Ht 68.5 in | Wt 164.0 lb

## 2015-07-03 DIAGNOSIS — S39012A Strain of muscle, fascia and tendon of lower back, initial encounter: Secondary | ICD-10-CM | POA: Diagnosis not present

## 2015-07-03 MED ORDER — IBUPROFEN 800 MG PO TABS
800.0000 mg | ORAL_TABLET | Freq: Three times a day (TID) | ORAL | Status: DC | PRN
Start: 2015-07-03 — End: 2016-09-28

## 2015-07-03 MED ORDER — HYDROCODONE-ACETAMINOPHEN 5-325 MG PO TABS: 1.0000 | ORAL_TABLET | Freq: Four times a day (QID) | ORAL | Status: DC | PRN

## 2015-07-03 MED ORDER — METHOCARBAMOL 500 MG PO TABS
ORAL_TABLET | ORAL | Status: DC
Start: 2015-07-03 — End: 2021-06-10

## 2015-07-03 NOTE — Patient Instructions (Signed)
Take methocarbamol muscle relaxant on in the morning, one in the afternoon, and 2 at bedtime  Take the ibuprofen 800 mg 3 times daily as needed for pain  Take the hydrocodone 5/325 every 4-6 hours as needed for more severe pain.  Do some back stretching exercises.  Back Exercises The following exercises strengthen the muscles that help to support the back. They also help to keep the lower back flexible. Doing these exercises can help to prevent back pain or lessen existing pain. If you have back pain or discomfort, try doing these exercises 2-3 times each day or as told by your health care provider. When the pain goes away, do them once each day, but increase the number of times that you repeat the steps for each exercise (do more repetitions). If you do not have back pain or discomfort, do these exercises once each day or as told by your health care provider. EXERCISES Single Knee to Chest Repeat these steps 3-5 times for each leg: 1. Lie on your back on a firm bed or the floor with your legs extended. 2. Bring one knee to your chest. Your other leg should stay extended and in contact with the floor. 3. Hold your knee in place by grabbing your knee or thigh. 4. Pull on your knee until you feel a gentle stretch in your lower back. 5. Hold the stretch for 10-30 seconds. 6. Slowly release and straighten your leg. Pelvic Tilt Repeat these steps 5-10 times: 1. Lie on your back on a firm bed or the floor with your legs extended. 2. Bend your knees so they are pointing toward the ceiling and your feet are flat on the floor. 3. Tighten your lower abdominal muscles to press your lower back against the floor. This motion will tilt your pelvis so your tailbone points up toward the ceiling instead of pointing to your feet or the floor. 4. With gentle tension and even breathing, hold this position for 5-10 seconds. Cat-Cow Repeat these steps until your lower back becomes more flexible: 1. Get into a  hands-and-knees position on a firm surface. Keep your hands under your shoulders, and keep your knees under your hips. You may place padding under your knees for comfort. 2. Let your head hang down, and point your tailbone toward the floor so your lower back becomes rounded like the back of a cat. 3. Hold this position for 5 seconds. 4. Slowly lift your head and point your tailbone up toward the ceiling so your back forms a sagging arch like the back of a cow. 5. Hold this position for 5 seconds. Press-Ups Repeat these steps 5-10 times: 1. Lie on your abdomen (face-down) on the floor. 2. Place your palms near your head, about shoulder-width apart. 3. While you keep your back as relaxed as possible and keep your hips on the floor, slowly straighten your arms to raise the top half of your body and lift your shoulders. Do not use your back muscles to raise your upper torso. You may adjust the placement of your hands to make yourself more comfortable. 4. Hold this position for 5 seconds while you keep your back relaxed. 5. Slowly return to lying flat on the floor. Bridges Repeat these steps 10 times: 1. Lie on your back on a firm surface. 2. Bend your knees so they are pointing toward the ceiling and your feet are flat on the floor. 3. Tighten your buttocks muscles and lift your buttocks off of the floor  until your waist is at almost the same height as your knees. You should feel the muscles working in your buttocks and the back of your thighs. If you do not feel these muscles, slide your feet 1-2 inches farther away from your buttocks. 4. Hold this position for 3-5 seconds. 5. Slowly lower your hips to the starting position, and allow your buttocks muscles to relax completely. If this exercise is too easy, try doing it with your arms crossed over your chest. Abdominal Crunches Repeat these steps 5-10 times: 1. Lie on your back on a firm bed or the floor with your legs extended. 2. Bend your knees  so they are pointing toward the ceiling and your feet are flat on the floor. 3. Cross your arms over your chest. 4. Tip your chin slightly toward your chest without bending your neck. 5. Tighten your abdominal muscles and slowly raise your trunk (torso) high enough to lift your shoulder blades a tiny bit off of the floor. Avoid raising your torso higher than that, because it can put too much stress on your low back and it does not help to strengthen your abdominal muscles. 6. Slowly return to your starting position. Back Lifts Repeat these steps 5-10 times: 1. Lie on your abdomen (face-down) with your arms at your sides, and rest your forehead on the floor. 2. Tighten the muscles in your legs and your buttocks. 3. Slowly lift your chest off of the floor while you keep your hips pressed to the floor. Keep the back of your head in line with the curve in your back. Your eyes should be looking at the floor. 4. Hold this position for 3-5 seconds. 5. Slowly return to your starting position. SEEK MEDICAL CARE IF:  Your back pain or discomfort gets much worse when you do an exercise.  Your back pain or discomfort does not lessen within 2 hours after you exercise. If you have any of these problems, stop doing these exercises right away. Do not do them again unless your health care provider says that you can. SEEK IMMEDIATE MEDICAL CARE IF:  You develop sudden, severe back pain. If this happens, stop doing the exercises right away. Do not do them again unless your health care provider says that you can.   This information is not intended to replace advice given to you by your health care provider. Make sure you discuss any questions you have with your health care provider.   Document Released: 10/17/2004 Document Revised: 05/31/2015 Document Reviewed: 11/03/2014 [employer] Interactive Patient Education [employer]

## 2015-07-03 NOTE — Progress Notes (Signed)
Patient ID: Edward Qualia., male    DOB: 1961/08/06  Age: 54 y.o. MRN: [id number]  Chief Complaint  Patient presents with  . Back Pain    Subjective:   Patient has been hurting his back last few days. 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 He had some upper back problems a year ago. No radicular pain.  He asked me a little about his memory. He does smoke. That was a little bit of an incidental conversation, but we had a long talk about his tobacco abuse.  Current allergies, medications, problem list, past/family and social histories reviewed.  Objective:  BP 134/82 mmHg  Pulse 85  Temp(Src) 98.9 F (37.2 C) (Oral)  Resp 17  Ht 5' 8.5" (1.74 m)  Wt 164 lb (74.39 kg)  BMI 24.57 kg/m2  SpO2 97%  No acute distress except when he moves around in his back hurt. He's tender in the paraspinous muscles of the lower lumbar area. He seemed tighter on the left than the right than the right seemed to hurt more than the left. Straight leg raising is negative. Deep tendon reflexes symmetrical. Painful to flex forward. Mild pain on extension. Pain on side to side tilt. Strength good.  Assessment & Plan:   Assessment: 1. Lumbar strain, initial encounter       Plan: Back strain, lumbar region will be treated with muscle relaxant and anti-inflammatory pain reliever and a few pain pills for nighttime use. Talked to him about quitting smoking. He will think seriously about that. He was advised to pick pick a quit date.        Patient Instructions  Take methocarbamol muscle relaxant on in the morning, one in the afternoon, and 2 at bedtime  Take the ibuprofen 800 mg 3 times daily as needed for pain  Take the hydrocodone 5/325 every 4-6 hours as needed for more severe pain.  Do some back stretching exercises.  Back Exercises The following exercises strengthen the muscles that help to support the back. They also help to keep the lower back  flexible. Doing these exercises can help to prevent back pain or lessen existing pain. If you have back pain or discomfort, try doing these exercises 2-3 times each day or as told by your health care provider. When the pain goes away, do them once each day, but increase the number of times that you repeat the steps for each exercise (do more repetitions). If you do not have back pain or discomfort, do these exercises once each day or as told by your health care provider. EXERCISES Single Knee to Chest Repeat these steps 3-5 times for each leg: 1. Lie on your back on a firm bed or the floor with your legs extended. 2. Bring one knee to your chest. Your other leg should stay extended and in contact with the floor. 3. Hold your knee in place by grabbing your knee or thigh. 4. Pull on your knee until you feel a gentle stretch in your lower back. 5. Hold the stretch for 10-30 seconds. 6. Slowly release and straighten your leg. Pelvic Tilt Repeat these steps 5-10 times: 1. Lie on your back on a firm bed or the floor with your legs extended. 2. Bend your knees so they are pointing toward the ceiling and your feet are flat on the floor. 3. Tighten your lower abdominal muscles to press your lower back against the floor. This motion will tilt  your pelvis so your tailbone points up toward the ceiling instead of pointing to your feet or the floor. 4. With gentle tension and even breathing, hold this position for 5-10 seconds. Cat-Cow Repeat these steps until your lower back becomes more flexible: 1. Get into a hands-and-knees position on a firm surface. Keep your hands under your shoulders, and keep your knees under your hips. You may place padding under your knees for comfort. 2. Let your head hang down, and point your tailbone toward the floor so your lower back becomes rounded like the back of a cat. 3. Hold this position for 5 seconds. 4. Slowly lift your head and point your tailbone up toward the  ceiling so your back forms a sagging arch like the back of a cow. 5. Hold this position for 5 seconds. Press-Ups Repeat these steps 5-10 times: 1. Lie on your abdomen (face-down) on the floor. 2. Place your palms near your head, about shoulder-width apart. 3. While you keep your back as relaxed as possible and keep your hips on the floor, slowly straighten your arms to raise the top half of your body and lift your shoulders. Do not use your back muscles to raise your upper torso. You may adjust the placement of your hands to make yourself more comfortable. 4. Hold this position for 5 seconds while you keep your back relaxed. 5. Slowly return to lying flat on the floor. Bridges Repeat these steps 10 times: 1. Lie on your back on a firm surface. 2. Bend your knees so they are pointing toward the ceiling and your feet are flat on the floor. 3. Tighten your buttocks muscles and lift your buttocks off of the floor until your waist is at almost the same height as your knees. You should feel the muscles working in your buttocks and the back of your thighs. If you do not feel these muscles, slide your feet 1-2 inches farther away from your buttocks. 4. Hold this position for 3-5 seconds. 5. Slowly lower your hips to the starting position, and allow your buttocks muscles to relax completely. If this exercise is too easy, try doing it with your arms crossed over your chest. Abdominal Crunches Repeat these steps 5-10 times: 1. Lie on your back on a firm bed or the floor with your legs extended. 2. Bend your knees so they are pointing toward the ceiling and your feet are flat on the floor. 3. Cross your arms over your chest. 4. Tip your chin slightly toward your chest without bending your neck. 5. Tighten your abdominal muscles and slowly raise your trunk (torso) high enough to lift your shoulder blades a tiny bit off of the floor. Avoid raising your torso higher than that, because it can put too much  stress on your low back and it does not help to strengthen your abdominal muscles. 6. Slowly return to your starting position. Back Lifts Repeat these steps 5-10 times: 1. Lie on your abdomen (face-down) with your arms at your sides, and rest your forehead on the floor. 2. Tighten the muscles in your legs and your buttocks. 3. Slowly lift your chest off of the floor while you keep your hips pressed to the floor. Keep the back of your head in line with the curve in your back. Your eyes should be looking at the floor. 4. Hold this position for 3-5 seconds. 5. Slowly return to your starting position. SEEK MEDICAL CARE IF:  Your back pain or discomfort gets much  worse when you do an exercise.  Your back pain or discomfort does not lessen within 2 hours after you exercise. If you have any of these problems, stop doing these exercises right away. Do not do them again unless your health care provider says that you can. SEEK IMMEDIATE MEDICAL CARE IF:  You develop sudden, severe back pain. If this happens, stop doing the exercises right away. Do not do them again unless your health care provider says that you can.   This information is not intended to replace advice given to you by your health care provider. Make sure you discuss any questions you have with your health care provider.   Document Released: 10/17/2004 Document Revised: 05/31/2015 Document Reviewed: 11/03/2014 Elsevier Interactive Patient Education [employer]      Return if symptoms worsen or fail to improve.   HOPPER,DAVID, MD 07/03/2015

## 2016-09-28 ENCOUNTER — Emergency Department (HOSPITAL_BASED_OUTPATIENT_CLINIC_OR_DEPARTMENT_OTHER)
Admission: EM | Admit: 2016-09-28 | Discharge: 2016-09-28 | Disposition: A | Discharge status: Home / Self Care | Payer: BLUE CROSS/BLUE SHIELD | Attending: Emergency Medicine | Admitting: Emergency Medicine

## 2016-09-28 ENCOUNTER — Ambulatory Visit: Payer: BLUE CROSS/BLUE SHIELD

## 2016-09-28 ENCOUNTER — Encounter (HOSPITAL_BASED_OUTPATIENT_CLINIC_OR_DEPARTMENT_OTHER): Payer: Self-pay | Admitting: *Deleted

## 2016-09-28 ENCOUNTER — Emergency Department (HOSPITAL_BASED_OUTPATIENT_CLINIC_OR_DEPARTMENT_OTHER): Payer: BLUE CROSS/BLUE SHIELD

## 2016-09-28 DIAGNOSIS — H5711 Ocular pain, right eye: Secondary | ICD-10-CM | POA: Diagnosis not present

## 2016-09-28 DIAGNOSIS — F17200 Nicotine dependence, unspecified, uncomplicated: Secondary | ICD-10-CM | POA: Insufficient documentation

## 2016-09-28 DIAGNOSIS — S39012A Strain of muscle, fascia and tendon of lower back, initial encounter: Secondary | ICD-10-CM

## 2016-09-28 DIAGNOSIS — Y9389 Activity, other specified: Secondary | ICD-10-CM | POA: Insufficient documentation

## 2016-09-28 DIAGNOSIS — S0501XA Injury of conjunctiva and corneal abrasion without foreign body, right eye, initial encounter: Secondary | ICD-10-CM | POA: Diagnosis not present

## 2016-09-28 DIAGNOSIS — Y999 Unspecified external cause status: Secondary | ICD-10-CM | POA: Diagnosis not present

## 2016-09-28 DIAGNOSIS — T1501XA Foreign body in cornea, right eye, initial encounter: Secondary | ICD-10-CM | POA: Diagnosis not present

## 2016-09-28 DIAGNOSIS — Y929 Unspecified place or not applicable: Secondary | ICD-10-CM | POA: Diagnosis not present

## 2016-09-28 DIAGNOSIS — W228XXA Striking against or struck by other objects, initial encounter: Secondary | ICD-10-CM | POA: Diagnosis not present

## 2016-09-28 DIAGNOSIS — Z23 Encounter for immunization: Secondary | ICD-10-CM | POA: Diagnosis not present

## 2016-09-28 DIAGNOSIS — T1591XA Foreign body on external eye, part unspecified, right eye, initial encounter: Secondary | ICD-10-CM

## 2016-09-28 DIAGNOSIS — H538 Other visual disturbances: Secondary | ICD-10-CM | POA: Diagnosis not present

## 2016-09-28 MED ORDER — ERYTHROMYCIN 5 MG/GM OP OINT
TOPICAL_OINTMENT | Freq: Once | OPHTHALMIC | Status: AC
  Administered 2016-09-28: 12:00:00 via OPHTHALMIC
  Filled 2016-09-28: qty 3.5

## 2016-09-28 MED ORDER — TETANUS-DIPHTH-ACELL PERTUSSIS 5-2.5-18.5 LF-MCG/0.5 IM SUSP
0.5000 mL | Freq: Once | INTRAMUSCULAR | Status: AC
  Administered 2016-09-28: 0.5 mL via INTRAMUSCULAR
  Filled 2016-09-28: qty 0.5

## 2016-09-28 MED ORDER — FLUORESCEIN SODIUM 0.6 MG OP STRP
1.0000 | ORAL_STRIP | Freq: Once | OPHTHALMIC | Status: AC
  Administered 2016-09-28: 1 via OPHTHALMIC
  Filled 2016-09-28: qty 1

## 2016-09-28 MED ORDER — TETRACAINE HCL 0.5 % OP SOLN
2.0000 [drp] | Freq: Once | OPHTHALMIC | Status: AC
  Administered 2016-09-28: 2 [drp] via OPHTHALMIC
  Filled 2016-09-28: qty 4

## 2016-09-28 MED ORDER — IBUPROFEN 800 MG PO TABS: 800.0000 mg | ORAL_TABLET | Freq: Three times a day (TID) | ORAL | 0 refills | Status: DC | PRN

## 2016-09-28 NOTE — ED Triage Notes (Signed)
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 Pt's R eye is red and pt reports his vision is blurry. Reports eye pain and light sensitivity. States he's been putting a lubricating eye ointment cream twice a day with no relief.

## 2016-09-28 NOTE — ED Provider Notes (Signed)
[facility] Provider Note   CSN: [id number] Arrival date & time: 09/28/16  4098     History   Chief Complaint Chief Complaint  Patient presents with  . Eye Injury    HPI Edward Mcguire. is a 56 y.o. male.  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 He did recall wearing protective eye glasses. Since then he has noticed increasing discomfort most significant in the medial corner of the right eye as well as increased redness of the eye. Report mild discomfort with eye movement. Denies any significant vision changes or stricture eye movement. He has been using an over-the-counter lubricating ointment with minimal relief. States that the redness of the eye has been persistent. Report mild light sensitivity. Denies any discomfort to his left eye. He does not wear corrective lenses. He did recall having a retained metal speck in his R eye many years in the past that haven't been removed.  Denies any pain to that specific location.  He is not up-to-date with his tetanus.   Past Medical History:  Diagnosis Date  . Hernia     Patient Active Problem List   Diagnosis Date Noted  . Tobacco abuse 03/12/2013    Past Surgical History:  Procedure Laterality Date  . HERNIA REPAIR  03/2013       Home Medications    Prior to Admission medications   Medication Sig Start Date End Date Taking? Authorizing Provider  HYDROcodone-acetaminophen (NORCO) 5-325 MG tablet Take 1 tablet by mouth every 6 (six) hours as needed. 07/03/15   Peyton Najjar, MD  ibuprofen (ADVIL,MOTRIN) 800 MG tablet Take 1 tablet (800 mg total) by mouth every 8 (eight) hours as needed for moderate pain. 07/03/15   Peyton Najjar, MD  methocarbamol (ROBAXIN) 500 MG tablet Take one in the AM , one  in the afternoon and 2 at bedtime for muscle relaxation 07/03/15   Peyton Najjar, MD    Family History No family history on file.  Social History Social History  Substance Use Topics  . Smoking status: Current Every Day Smoker    Packs/day: 2.00    Years: 31.00  . Smokeless tobacco: Never Used  . Alcohol use No     Allergies   Patient has no known allergies.   Review of Systems Review of Systems  Constitutional: Negative for fever.  Eyes: Positive for pain and redness. Negative for discharge.  Skin: Negative for rash.     Physical Exam Updated Vital Signs BP 154/92 (BP Location: Right Arm)   Pulse 79   Temp 98.2 F (36.8 C) (Oral)   Resp 16   Ht 5\' 9"  (1.753 m)   Wt 77.1 kg   SpO2 98%   BMI 25.10 kg/m   Physical Exam  Constitutional: He appears well-developed and well-nourished. No distress.  HENT:  Head: Atraumatic.  Eyes: EOM are normal. Pupils are equal, round, and reactive to light. Right eye exhibits no chemosis, no discharge, no exudate and no hordeolum. Foreign body present in the right eye. Right conjunctiva is injected. Right conjunctiva has no hemorrhage. No scleral icterus.    Neck: Neck supple.  Neurological: He is alert.  Skin: No rash noted.  Psychiatric: He has a normal mood and affect.  Nursing note and vitals reviewed.    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 Pt's R eye is red and pt reports his vision is blurry. Reports eye pain and light sensitivity. EXAM: CT ORBITS WITHOUT CONTRAST TECHNIQUE: Multidetector CT images were obtained using the standard protocol without intravenous contrast. COMPARISON:  None.  FINDINGS: Orbits: Small density projects along the medial aspect of the knee preseptal left orbit, near the medial canthus, likely an incidental calcification, less dense than metal. This is also on the asymptomatic side. On the right, there are no radiopaque foreign bodies. There is no mass or inflammation. Both globes are unremarkable. Visualized sinuses: Clear. Soft tissues: Negative. Limited intracranial: No significant or unexpected finding. IMPRESSION: 1. No abnormality of the right globe or orbit. No metallic foreign body. 2. Small density along the preseptal soft tissues of the medial left orbit near the medial canthus. Based on density, this appears be a calcification, and is most likely a benign incidental finding. 3. Exam otherwise unremarkable. Electronically Signed   By: Amie Portlandavid  Ormond M.D.   On: 09/28/2016 10:12    Procedures Procedures (including critical care time)  Medications Ordered in ED Medications  erythromycin ophthalmic ointment (not administered)  Tdap (BOOSTRIX) injection 0.5 mL (not administered)  fluorescein ophthalmic strip 1 strip (1 strip Right Eye Given by Other 09/28/16 0939)  tetracaine (PONTOCAINE) 0.5 % ophthalmic solution 2 drop (2 drops Right Eye Given by Other 09/28/16 [id number])     Initial Impression / Assessment and Plan / ED Course  I have reviewed the triage vital signs and the nursing notes.  Pertinent labs & imaging results that were available during my care of the patient were reviewed by me and considered in my medical decision making (see chart for details).  Clinical Course     BP 137/85 (BP Location: Right Arm)   Pulse 82   Temp 98.2 F (36.8 C) (Oral)   Resp 18   Ht 5\' 9"  (1.753 m)   Wt 77.1 kg   SpO2 98%   BMI 25.10 kg/m    Final Clinical Impressions(s) / ED Diagnoses   Final diagnoses:  Acute right eye pain    New Prescriptions Current Discharge Medication List     9:39 AM Pt report experiencing fb sensation to R eye after  welding and metal grinding several days prior.  His R eye is injected.  There's a metal speck noted to the cornea.    11:17 AM Orbital CT without acute foreign object or concerning finding. Patient report moderate improvement after tetracaine numbing eyedrops applied. This suggests that his symptoms likely to be superficial. Metal speck was removed using an 18-gauge needle with moderate success. His intraocular pressure is normal at 16 and 15 respectively. Erythromycin eye ointment given for treatment of corneal abrasion secondary to fb.  tdap given.   Recommend pt to f/u with ophthalmology for further outpt management.     Fayrene HelperBowie Hoa Deriso, PA-C 09/28/16 1124    Doug SouSam Jacubowitz, MD 09/28/16 [phone]

## 2016-09-28 NOTE — Discharge Instructions (Signed)
You have a foreign body in your right eye that we have removed.  Apply 1cm ribbon on erythromycin ointment to the lower lid of right eye every 6 hrs while awake for the next 5 days.  Take ibuprofen as needed for pain.  Follow up with eye specialist for further care.  Return if you have any concerns.

## 2017-06-30 ENCOUNTER — Encounter: Payer: Self-pay | Admitting: Physician Assistant

## 2017-06-30 ENCOUNTER — Ambulatory Visit (INDEPENDENT_AMBULATORY_CARE_PROVIDER_SITE_OTHER): Payer: BLUE CROSS/BLUE SHIELD | Admitting: Physician Assistant

## 2017-06-30 VITALS — BP 112/70 | HR 74 | Temp 98.1°F | Resp 16 | Ht 69.0 in | Wt 157.8 lb

## 2017-06-30 DIAGNOSIS — Z131 Encounter for screening for diabetes mellitus: Secondary | ICD-10-CM

## 2017-06-30 DIAGNOSIS — Z87891 Personal history of nicotine dependence: Secondary | ICD-10-CM

## 2017-06-30 DIAGNOSIS — R05 Cough: Secondary | ICD-10-CM

## 2017-06-30 DIAGNOSIS — R062 Wheezing: Secondary | ICD-10-CM

## 2017-06-30 DIAGNOSIS — R059 Cough, unspecified: Secondary | ICD-10-CM

## 2017-06-30 DIAGNOSIS — Z9189 Other specified personal risk factors, not elsewhere classified: Secondary | ICD-10-CM | POA: Diagnosis not present

## 2017-06-30 MED ORDER — IPRATROPIUM BROMIDE 0.02 % IN SOLN
0.5000 mg | Freq: Once | RESPIRATORY_TRACT | Status: AC
  Administered 2017-06-30: 0.5 mg via RESPIRATORY_TRACT

## 2017-06-30 MED ORDER — AZITHROMYCIN 250 MG PO TABS: ORAL_TABLET | ORAL | 0 refills | Status: DC

## 2017-06-30 MED ORDER — ALBUTEROL SULFATE (2.5 MG/3ML) 0.083% IN NEBU
2.5000 mg | INHALATION_SOLUTION | Freq: Once | RESPIRATORY_TRACT | Status: AC
  Administered 2017-06-30: 2.5 mg via RESPIRATORY_TRACT

## 2017-06-30 MED ORDER — PREDNISONE 50 MG PO TABS: 50.0000 mg | ORAL_TABLET | Freq: Every day | ORAL | 0 refills | Status: DC

## 2017-06-30 NOTE — Progress Notes (Signed)
06/30/2017 3:49 PM   DOB: 02/22/1961 / MRN: [id number]  SUBJECTIVE:  Edward Mcguire. is a 56 y.o. male presenting for cough and sinus congestion that started abiout days ago. Tells me that he is getting a little better.  Some other people at his work have had similar symptoms.  No chest pain or leg swelling.  Smokes about two packs daily for about 30 years.   He has No Known Allergies.   He  has a past medical history of Hernia.    He  reports that he has been smoking.  He has a 62.00 pack-year smoking history. He has never used smokeless tobacco. He reports that he does not drink alcohol or use drugs. He  reports that he currently engages in sexual activity. The patient  has a past surgical history that includes Hernia repair (03/2013).  His family history is not on file.  Review of Systems  Constitutional: Negative for fever.    The problem list and medications were reviewed and updated by myself where necessary and exist elsewhere in the encounter.   OBJECTIVE:  BP 112/70 (BP Location: Left Arm, Patient Position: Sitting, Cuff Size: Normal)   Pulse 74   Temp 98.1 F (36.7 C) (Oral)   Resp 16   Ht  (1.753 m)   Wt 157 lb 12.8 oz (71.6 kg)   SpO2 96%   BMI 23.30 kg/m   Physical Exam  Constitutional: He is oriented to person, place, and time.  Eyes: Pupils are equal, round, and reactive to light. EOM are normal.  Cardiovascular: Normal rate and regular rhythm.   Pulmonary/Chest: Effort normal. No respiratory distress. He has wheezes (resolved with nebs). He has no rales. He exhibits no tenderness.  Musculoskeletal: Normal range of motion.  Neurological: He is alert and oriented to person, place, and time. He has normal strength and normal reflexes. He is not disoriented. No cranial nerve deficit or sensory deficit. He exhibits normal muscle tone. Coordination and gait normal.  Psychiatric: His behavior is normal.    No results found for this or any previous visit  (from the past 72 hour(s)).  No results found.  ASSESSMENT AND PLAN:  [name] was seen today for cough/ congestion.  Diagnoses and all orders for this visit:  Cough: Peak flow at 400.  Wheezing resolved with one peak flow. Advised that the smoking has finally caught up with him and this is most likely COPD.  Advised that he needs to quit.  He will look into patches and gum.  Will cover for a COPD flare.  A!c pending but he is thin and diabetes seems unliekly.  -     CBC -     Basic metabolic panel -     azithromycin (ZITHROMAX) 250 MG tablet; Take two tabs on day one and one daily thereafter. -     predniSONE (DELTASONE) 50 MG tablet; Take 1 tablet (50 mg total) by mouth daily with breakfast.  History of smoking 30 or more pack years  Wheezing -     albuterol (PROVENTIL) (2.5 MG/3ML) 0.083% nebulizer solution 2.5 mg; Take 3 mLs (2.5 mg total) by nebulization once. -     ipratropium (ATROVENT) nebulizer solution 0.5 mg; Take 2.5 mLs (0.5 mg total) by nebulization once.  Screening for diabetes mellitus -     Hemoglobin A1c  At risk for cardiovascular event -     Lipid panel      The patient is advised  to call or return to clinic if he does not see an improvement in symptoms, or to seek the care of the closest emergency department if he worsens with the above plan.   Deliah Boston, MHS, PA-C Primary Care at [facility] 06/30/2017 3:49 PM

## 2017-06-30 NOTE — Patient Instructions (Signed)
     IF you received an x-ray today, you will receive an invoice from [facility]. Please contact [facility] at [phone] with questions or concerns regarding your invoice.   IF you received labwork today, you will receive an invoice from [facility]. Please contact [facility] at [phone] with questions or concerns regarding your invoice.   Our billing staff will not be able to assist you with questions regarding bills from these companies.  You will be contacted with the lab results as soon as they are available. The fastest way to get your results is to activate your My Chart account. Instructions are located on the last page of this paperwork. If you have not heard from us regarding the results in 2 weeks, please contact this office.     

## 2017-07-01 LAB — CBC
HEMATOCRIT: 47.3 % (ref 37.5–51.0)
Hemoglobin: 16.6 g/dL (ref 13.0–17.7)
MCH: 31 pg (ref 26.6–33.0)
MCHC: 35.1 g/dL (ref 31.5–35.7)
MCV: 88 fL (ref 79–97)
Platelets: 272 10*3/uL (ref 150–379)
RBC: 5.35 x10E6/uL (ref 4.14–5.80)
RDW: 13.4 % (ref 12.3–15.4)
WBC: 6.9 10*3/uL (ref 3.4–10.8)

## 2017-07-01 LAB — LIPID PANEL
CHOL/HDL RATIO: 5.1 ratio — AB (ref 0.0–5.0)
Cholesterol, Total: 167 mg/dL (ref 100–199)
HDL: 33 mg/dL — ABNORMAL LOW (ref >39)
LDL Calculated: 102 mg/dL — ABNORMAL HIGH (ref 0–99)
TRIGLYCERIDES: 158 mg/dL — AB (ref 0–149)
VLDL Cholesterol Cal: 32 mg/dL (ref 5–40)

## 2017-07-01 LAB — BASIC METABOLIC PANEL
BUN / CREAT RATIO: 6 — AB (ref 9–20)
BUN: 7 mg/dL (ref 6–24)
CO2: 22 mmol/L (ref 20–29)
CREATININE: 1.16 mg/dL (ref 0.76–1.27)
Calcium: 9.4 mg/dL (ref 8.7–10.2)
Chloride: 102 mmol/L (ref 96–106)
GFR calc non Af Amer: 71 mL/min/{1.73_m2} (ref >59)
GFR, EST AFRICAN AMERICAN: 82 mL/min/{1.73_m2} (ref >59)
GLUCOSE: 92 mg/dL (ref 65–99)
Potassium: 3.8 mmol/L (ref 3.5–5.2)
SODIUM: 142 mmol/L (ref 134–144)

## 2017-07-01 LAB — HEMOGLOBIN A1C
ESTIMATED AVERAGE GLUCOSE: 100 mg/dL
HEMOGLOBIN A1C: 5.1 % (ref 4.8–5.6)

## 2017-09-30 ENCOUNTER — Ambulatory Visit (INDEPENDENT_AMBULATORY_CARE_PROVIDER_SITE_OTHER): Payer: BLUE CROSS/BLUE SHIELD

## 2017-09-30 ENCOUNTER — Ambulatory Visit: Payer: BLUE CROSS/BLUE SHIELD | Admitting: Physician Assistant

## 2017-09-30 ENCOUNTER — Encounter: Payer: Self-pay | Admitting: Physician Assistant

## 2017-09-30 VITALS — BP 129/75 | HR 78 | Temp 98.3°F | Ht 69.0 in | Wt 157.0 lb

## 2017-09-30 DIAGNOSIS — J441 Chronic obstructive pulmonary disease with (acute) exacerbation: Secondary | ICD-10-CM | POA: Diagnosis not present

## 2017-09-30 DIAGNOSIS — R059 Cough, unspecified: Secondary | ICD-10-CM

## 2017-09-30 DIAGNOSIS — R05 Cough: Secondary | ICD-10-CM

## 2017-09-30 LAB — POCT CBC
Granulocyte percent: 75.1 % (ref 37–80)
HCT, POC: 44.8 % (ref 43.5–53.7)
Hemoglobin: 14.8 g/dL (ref 14.1–18.1)
Lymph, poc: 1.7 (ref 0.6–3.4)
MCH, POC: 29 pg (ref 27–31.2)
MCHC: 33.1 g/dL (ref 31.8–35.4)
MCV: 87.7 fL (ref 80–97)
MID (cbc): 0.5 (ref 0–0.9)
MPV: 7.2 fL (ref 0–99.8)
POC Granulocyte: 6.7 (ref 2–6.9)
POC LYMPH PERCENT: 18.8 % (ref 10–50)
POC MID %: 6.1 % (ref 0–12)
Platelet Count, POC: 236 10*3/uL (ref 142–424)
RBC: 5.1 M/uL (ref 4.69–6.13)
RDW, POC: 12.5 %
WBC: 8.9 10*3/uL (ref 4.6–10.2)

## 2017-09-30 MED ORDER — PREDNISONE 50 MG PO TABS: 50.0000 mg | ORAL_TABLET | Freq: Every day | ORAL | 0 refills | Status: AC

## 2017-09-30 MED ORDER — ALBUTEROL SULFATE (2.5 MG/3ML) 0.083% IN NEBU
2.5000 mg | INHALATION_SOLUTION | Freq: Once | RESPIRATORY_TRACT | Status: AC
  Administered 2017-09-30: 2.5 mg via RESPIRATORY_TRACT

## 2017-09-30 MED ORDER — AZITHROMYCIN 250 MG PO TABS: ORAL_TABLET | ORAL | 0 refills | Status: DC

## 2017-09-30 MED ORDER — IPRATROPIUM BROMIDE 0.02 % IN SOLN
0.5000 mg | Freq: Once | RESPIRATORY_TRACT | Status: AC
  Administered 2017-09-30: 0.5 mg via RESPIRATORY_TRACT

## 2017-09-30 NOTE — Progress Notes (Signed)
Edward Mcguire.  MRN: [id number] DOB: 01/24/1961  PCP: Loyal Jacobson, MD  Subjective:  Pt is a 57 year old male who presents to clinic for cough and congestion x several days. Endorses fatigue, congestion and shob.   He was last here for this problem 06/30/2017 and treated for COPD exacerbation.  Denies fever, chills, nausea, diaphoresis.  Smokes 2 ppd x 30 years.   Review of Systems  Constitutional: Positive for fatigue. Negative for chills, diaphoresis and fever.  HENT: Positive for congestion.   Respiratory: Positive for cough and shortness of breath. Negative for chest tightness and wheezing.   Cardiovascular: Negative for chest pain, palpitations and leg swelling.  Neurological: Negative for dizziness, syncope, light-headedness and headaches.  Psychiatric/Behavioral: Negative for sleep disturbance. The patient is not nervous/anxious.     Patient Active Problem List   Diagnosis Date Noted  . Tobacco abuse 03/12/2013    Current Outpatient Medications on File Prior to Visit  Medication Sig Dispense Refill  . azithromycin (ZITHROMAX) 250 MG tablet Take two tabs on day one and one daily thereafter. (Patient not taking: Reported on 09/30/2017) 6 tablet 0  . HYDROcodone-acetaminophen (NORCO) 5-325 MG tablet Take 1 tablet by mouth every 6 (six) hours as needed. (Patient not taking: Reported on 06/30/2017) 15 tablet 0  . ibuprofen (ADVIL,MOTRIN) 800 MG tablet Take 1 tablet (800 mg total) by mouth every 8 (eight) hours as needed for moderate pain. (Patient not taking: Reported on 09/30/2017) 30 tablet 0  . methocarbamol (ROBAXIN) 500 MG tablet Take one in the AM , one in the afternoon and 2 at bedtime for muscle relaxation (Patient not taking: Reported on 06/30/2017) 30 tablet 0   No current facility-administered medications on file prior to visit.     No Known Allergies   Objective:  BP 129/75 (BP Location: Right Arm, Patient Position: Sitting, Cuff Size: Normal)   Pulse 78    Temp 98.3 F (36.8 C) (Oral)   Ht 5\' 9"  (1.753 m)   Wt 157 lb (71.2 kg)   SpO2 93%   BMI 23.18 kg/m   Physical Exam  Constitutional: He is oriented to person, place, and time and well-developed, well-nourished, and in no distress. No distress.  HENT:  Right Ear: Tympanic membrane normal.  Left Ear: Tympanic membrane normal.  Nose: Mucosal edema present. No rhinorrhea. Right sinus exhibits no maxillary sinus tenderness and no frontal sinus tenderness. Left sinus exhibits no maxillary sinus tenderness and no frontal sinus tenderness.  Mouth/Throat: Oropharynx is clear and moist and mucous membranes are normal.  Cardiovascular: Normal rate, regular rhythm and normal heart sounds.  Pulmonary/Chest: Effort normal and breath sounds normal. No respiratory distress. He has no wheezes. He has no rales.  Neurological: He is alert and oriented to person, place, and time. GCS score is 15.  Skin: Skin is warm and dry.  Psychiatric: Mood, memory, affect and judgment normal.  Vitals reviewed.   Results for orders placed or performed in visit on 09/30/17  POCT CBC  Result Value Ref Range   WBC 8.9 4.6 - 10.2 K/uL   Lymph, poc 1.7 0.6 - 3.4   POC LYMPH PERCENT 18.8 10 - 50 %L   MID (cbc) 0.5 0 - 0.9   POC MID % 6.1 0 - 12 %M   POC Granulocyte 6.7 2 - 6.9   Granulocyte percent 75.1 37 - 80 %G   RBC 5.10 4.69 - 6.13 M/uL   Hemoglobin 14.8 14.1 - 18.1 g/dL  HCT, POC 44.8 43.5 - 53.7 %   MCV 87.7 80 - 97 fL   MCH, POC 29.0 27 - 31.2 pg   MCHC 33.1 31.8 - 35.4 g/dL   RDW, POC 16.112.5 %   Platelet Count, POC 236 142 - 424 K/uL   MPV 7.2 0 - 99.8 fL   Dg Chest 2 View  Result Date: 09/30/2017 CLINICAL DATA:  Two-month history of cough, associated with intermittent fever and chills. EXAM: CHEST  2 VIEW COMPARISON:  03/12/2013. FINDINGS: Cardiomediastinal silhouette unremarkable, unchanged. Mild hyperinflation, best seen on the lateral image. Mild central peribronchial thickening, more so than on  prior examination. Lungs otherwise clear. No localized airspace consolidation. No pleural effusions. No pneumothorax. Normal pulmonary vascularity. Degenerative changes involving the thoracic spine. IMPRESSION: Mild changes of acute asthma and/or bronchitis without focal airspace pneumonia. Electronically Signed   By: Hulan Saashomas  Lawrence M.D.   On: 09/30/2017 14:59    Assessment and Plan :  1. COPD exacerbation (HCC) - azithromycin (ZITHROMAX) 250 MG tablet; Take 2 tabs PO x 1 dose, then 1 tab PO QD x 4 days  Dispense: 6 tablet; Refill: 0 - predniSONE (DELTASONE) 50 MG tablet; Take 1 tablet (50 mg total) by mouth daily with breakfast for 5 days.  Dispense: 5 tablet; Refill: 0  2. Cough - DG Chest 2 View; Future - POCT CBC - Peak flow meter - albuterol (PROVENTIL) (2.5 MG/3ML) 0.083% nebulizer solution 2.5 mg - ipratropium (ATROVENT) nebulizer solution 0.5 mg - Peak flow <400. Repeat peak flow after breathing treatment is 440 and recheck Sp02 is 97%. Pt feels improvement of symptoms. Plan to treat for COPD exacerbation with azithromycin and prednisone. Encouraged smoking cessation. RTC in 7-10 days if no improvement.   Marco CollieWhitney Trashaun Streight, PA-C  Primary Care at [facility] 09/30/2017 2:33 PM

## 2017-09-30 NOTE — Patient Instructions (Addendum)
Continue to cut back on your smoking!  Stay well hydrated. Drink at least 1-2 liters of water daily. This will help thin out your mucus.  Use saline nasal spray or a neti pot (you can find these at your pharmacy) This will help clean out your nose.    Start Azithromycin antibiotic. Take this every day x 5 days. Finish your course of antibiotics, even if you start to feel better.  Take Prednisone 73m daily with breakfast x 5 days.   Come back if you are not better in 7 days.   Thank you for coming in today. I hope you feel we met your needs.  Feel free to call PCP if you have any questions or further requests.  Please consider signing up for MyChart if you do not already have it, as this is a great way to communicate with me.  Best,  Whitney McVey, PA-C   IF you received an x-ray today, you will receive an invoice from [facility]. Please contact [facility] at [phone] questions or concerns regarding your invoice.   IF you received labwork today, you will receive an invoice from [facility] Please contact [facility] at [phone] questions or concerns regarding your invoice.   Our billing staff will not be able to assist you with questions regarding bills from these companies.  You will be contacted with the lab results as soon as they are available. The fastest way to get your results is to activate your My Chart account. Instructions are located on the last page of this paperwork. If you have not heard from [facility] the results in 2 weeks, please contact this office.

## 2018-08-16 IMAGING — DX DG CHEST 2V
2 series · 2 of 2 positions shown · non-contrast
Comparison: 03/12/2013.

CLINICAL DATA: Two-month history of cough, associated with
intermittent fever and chills.

EXAM:
CHEST  2 VIEW

[chest pa]
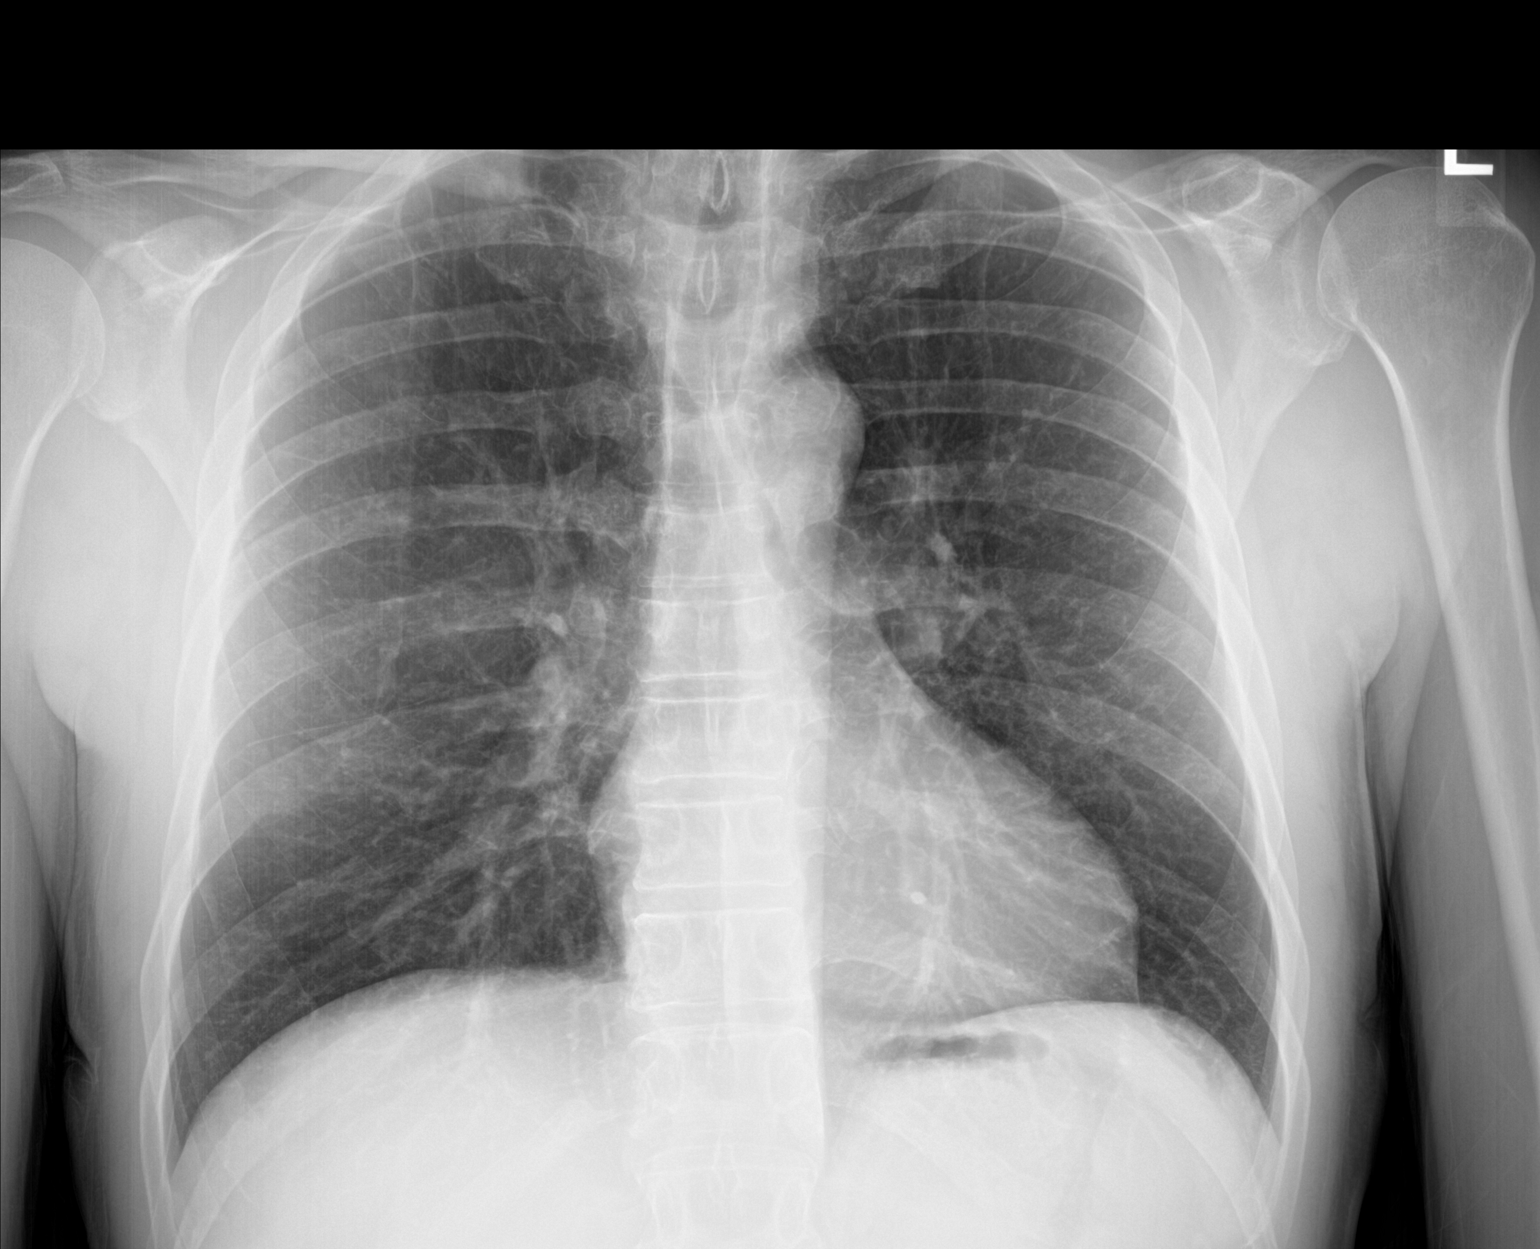

[chest lat]
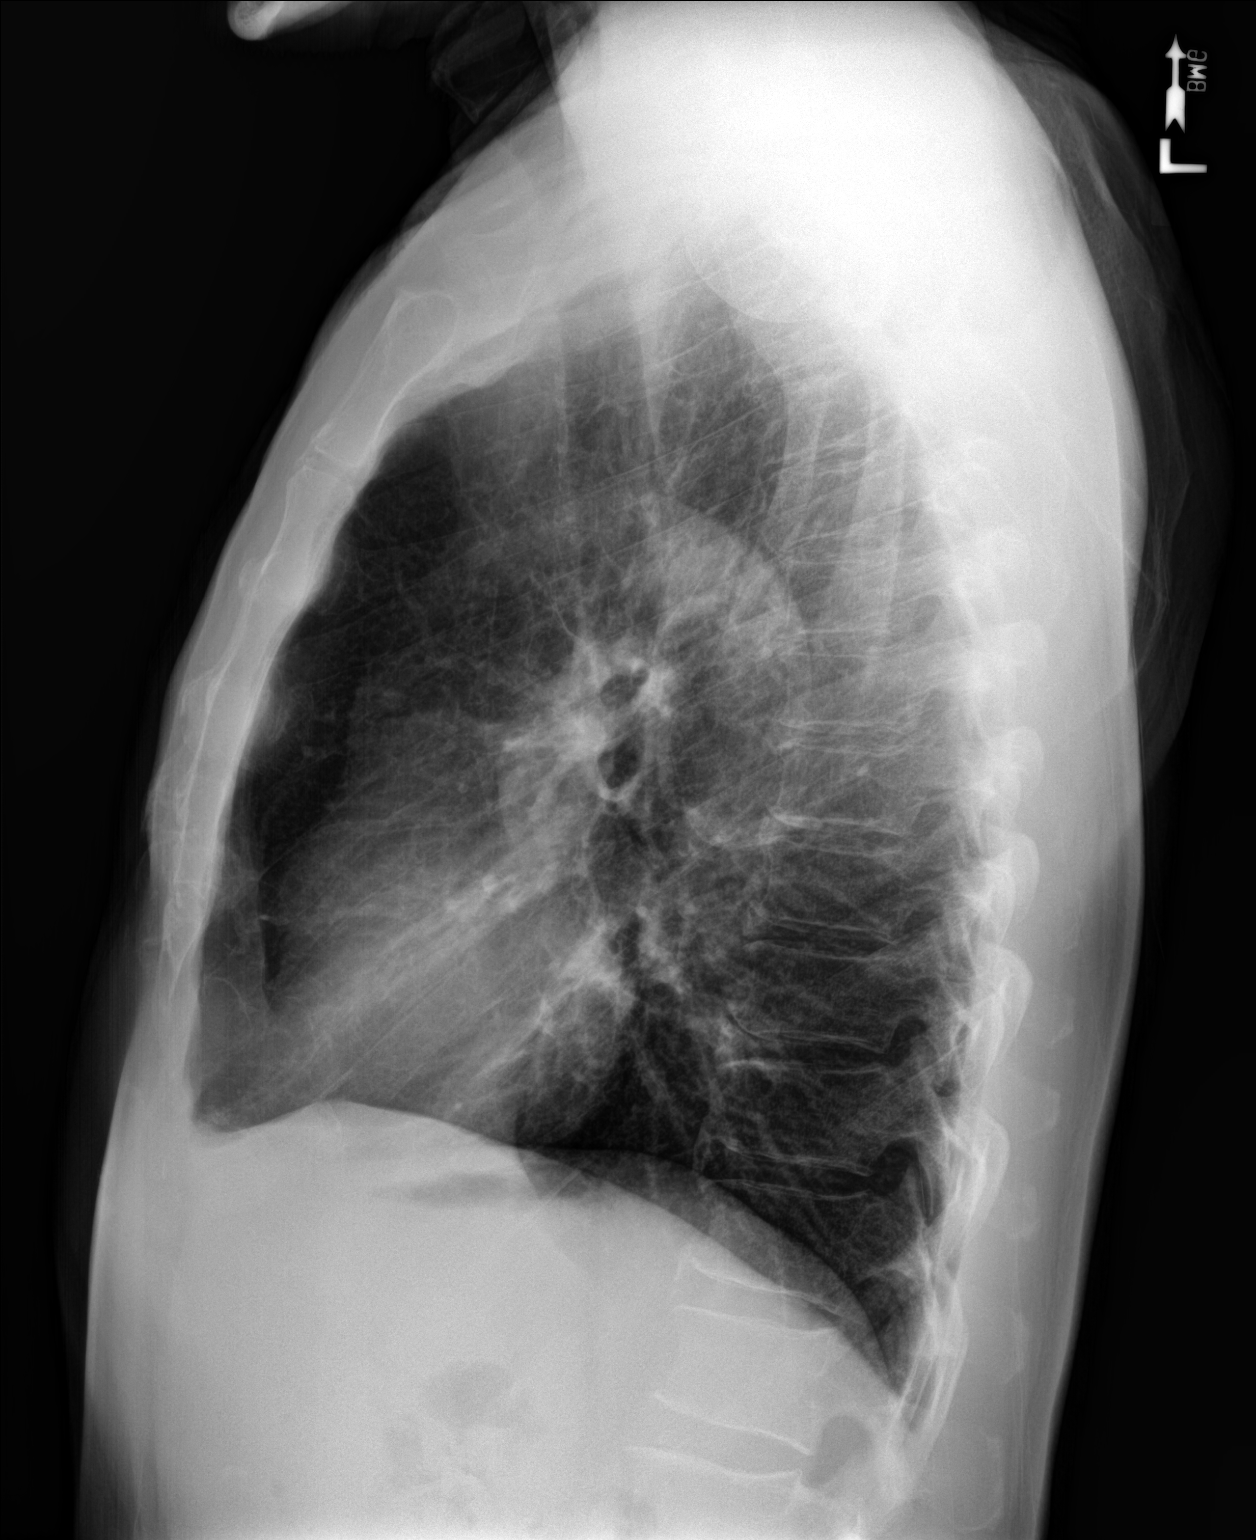

[2 of 2 positions shown; findings below may reference images not displayed]

FINDINGS: Cardiomediastinal silhouette unremarkable, unchanged. Mild
hyperinflation, best seen on the lateral image. Mild central
peribronchial thickening, more so than on prior examination. Lungs
otherwise clear. No localized airspace consolidation. No pleural
effusions. No pneumothorax. Normal pulmonary vascularity.
Degenerative changes involving the thoracic spine.
IMPRESSION: Mild changes of acute asthma and/or bronchitis without focal
airspace pneumonia.

## 2019-04-18 ENCOUNTER — Emergency Department (HOSPITAL_BASED_OUTPATIENT_CLINIC_OR_DEPARTMENT_OTHER)
Admission: EM | Admit: 2019-04-18 | Discharge: 2019-04-18 | Disposition: A | Discharge status: Home / Self Care | Payer: BC Managed Care – PPO | Attending: Emergency Medicine | Admitting: Emergency Medicine

## 2019-04-18 ENCOUNTER — Encounter (HOSPITAL_BASED_OUTPATIENT_CLINIC_OR_DEPARTMENT_OTHER): Payer: Self-pay | Admitting: Emergency Medicine

## 2019-04-18 ENCOUNTER — Other Ambulatory Visit: Payer: Self-pay

## 2019-04-18 ENCOUNTER — Emergency Department (HOSPITAL_BASED_OUTPATIENT_CLINIC_OR_DEPARTMENT_OTHER): Payer: BC Managed Care – PPO

## 2019-04-18 DIAGNOSIS — R221 Localized swelling, mass and lump, neck: Secondary | ICD-10-CM | POA: Diagnosis not present

## 2019-04-18 DIAGNOSIS — F17210 Nicotine dependence, cigarettes, uncomplicated: Secondary | ICD-10-CM | POA: Diagnosis not present

## 2019-04-18 LAB — BASIC METABOLIC PANEL
Anion gap: 9 (ref 5–15)
BUN: 14 mg/dL (ref 6–20)
CO2: 23 mmol/L (ref 22–32)
Calcium: 9 mg/dL (ref 8.9–10.3)
Chloride: 106 mmol/L (ref 98–111)
Creatinine, Ser: 1.08 mg/dL (ref 0.61–1.24)
GFR calc Af Amer: >60 mL/min (ref >60)
GFR calc non Af Amer: >60 mL/min (ref >60)
Glucose, Bld: 122 mg/dL — ABNORMAL HIGH (ref 70–99)
Potassium: 3.5 mmol/L (ref 3.5–5.1)
Sodium: 138 mmol/L (ref 135–145)

## 2019-04-18 MED ORDER — IOHEXOL 300 MG/ML  SOLN
100.0000 mL | Freq: Once | INTRAMUSCULAR | Status: AC | PRN
  Administered 2019-04-18: 75 mL via INTRAVENOUS

## 2019-04-18 NOTE — ED Triage Notes (Signed)
After eating on Friday, noticed swelling to left side of neck, denies pain

## 2019-04-18 NOTE — ED Provider Notes (Signed)
[facility] EMERGENCY DEPARTMENT Provider Note   CSN: [id number] Arrival date & time: 04/18/19  1114     History   Chief Complaint Chief Complaint  Patient presents with  . Swelling to neck    HPI Edward Qualialbert R Eastham Jr. is a 58 y.o. male.     Patient is a 58 year old male with a history of tobacco abuse but no other known medical problems presenting today with a swelling in the left side of his neck.  Patient noticed that 3 days ago he had some swelling in the left side of his neck.  He states it has not improved.  Denies pain, difficulty swallowing, fever, headaches or any other abnormalities.  He states he otherwise feels completely normal.  He has no dental pain or ear pain.  The history is provided by the patient.    Past Medical History:  Diagnosis Date  . Hernia     Patient Active Problem List   Diagnosis Date Noted  . Tobacco abuse 03/12/2013    Past Surgical History:  Procedure Laterality Date  . HERNIA REPAIR  03/2013        Home Medications    Prior to Admission medications   Medication Sig Start Date End Date Taking? Authorizing Provider  azithromycin (ZITHROMAX) 250 MG tablet Take two tabs on day one and one daily thereafter. Patient not taking: Reported on 09/30/2017 06/30/17   Ofilia Neaslark, Michael L, PA-C  azithromycin [facility](ZITHROMAX) 250 MG tablet Take 2 tabs PO x 1 dose, then 1 tab PO QD x 4 days 09/30/17   McVey, Madelaine BhatElizabeth Saliah Crisp, PA-C  HYDROcodone-acetaminophen (NORCO) 5-325 MG tablet Take 1 tablet by mouth every 6 (six) hours as needed. Patient not taking: Reported on 06/30/2017 07/03/15   Peyton NajjarHopper, David H, MD  ibuprofen (ADVIL,MOTRIN) 800 MG tablet Take 1 tablet (800 mg total) by mouth every 8 (eight) hours as needed for moderate pain. Patient not taking: Reported on 09/30/2017 09/28/16   Fayrene Helperran, Bowie, PA-C  methocarbamol (ROBAXIN) 500 MG tablet Take one in the AM , one in the afternoon and 2 at bedtime for muscle relaxation Patient not taking: Reported on  06/30/2017 07/03/15   Peyton NajjarHopper, David H, MD    Family History No family history on file.  Social History Social History   Tobacco Use  . Smoking status: Current Every Day Smoker    Packs/day: 2.00    Years: 31.00    Pack years: 62.00  . Smokeless tobacco: Never Used  Substance Use Topics  . Alcohol use: No    Alcohol/week: 0.0 standard drinks  . Drug use: No     Allergies   Patient has no known allergies.   Review of Systems Review of Systems  All other systems reviewed and are negative.    Physical Exam Updated Vital Signs BP (!) 148/81 (BP Location: Right Arm)   Pulse 73   Temp 98.1 F (36.7 C) (Oral)   Resp 18   SpO2 97%   Physical Exam Vitals signs and nursing note reviewed.  Constitutional:      General: He is not in acute distress.    Appearance: Normal appearance. He is normal weight.  HENT:     Head: Normocephalic.     Right Ear: Tympanic membrane normal.     Left Ear: Tympanic membrane normal.     Nose: Nose normal.     Mouth/Throat:     Mouth: Mucous membranes are moist.     Comments: Dental caries but no  pain with palpation of the teeth.  No tenderness along the gums or floor of the mouth Eyes:     Extraocular Movements: Extraocular movements intact.     Pupils: Pupils are equal, round, and reactive to light.  Neck:     Musculoskeletal: Normal range of motion.   Cardiovascular:     Rate and Rhythm: Normal rate.     Pulses: Normal pulses.  Pulmonary:     Effort: Pulmonary effort is normal.  Skin:    General: Skin is warm and dry.  Neurological:     General: No focal deficit present.     Mental Status: He is alert and oriented to person, place, and time. Mental status is at baseline.  Psychiatric:        Mood and Affect: Mood normal.        Behavior: Behavior normal.        Thought Content: Thought content normal.      ED Treatments / Results  Labs (all labs ordered are listed, but only abnormal results are displayed) Labs  Reviewed  BASIC METABOLIC PANEL - Abnormal; Notable for the following components:      Result Value   Glucose, Bld 122 (*)    All other components within normal limits    EKG None  Radiology Ct Soft Tissue Neck W Contrast  Result Date: 04/18/2019 CLINICAL DATA:  Neck mass, solitary, afebrile. Mass noted on Friday. EXAM: CT NECK WITH CONTRAST TECHNIQUE: Multidetector CT imaging of the neck was performed using the standard protocol following the bolus administration of intravenous contrast. CONTRAST:  75mL OMNIPAQUE IOHEXOL 300 MG/ML  SOLN COMPARISON:  None. FINDINGS: Pharynx and larynx: No mass or swelling. Salivary glands: No inflammation, mass, or stone. Thyroid: Normal. Lymph nodes: Cystic mass with peripheral soft tissue density and incomplete septations, 5 cm maximal span and located in the left jugulodigastric region. This is most likely cystic lymphadenopathy. The neighboring fat is not inflamed. No additional enlarged lymph node. Vascular: Extrinsic compression of the left IJ due to the above. Limited intracranial: Negative Visualized orbits: Negative Mastoids and visualized paranasal sinuses: Clear Skeleton: No acute or destructive changes. Upper chest: Emphysema. Two right apical pulmonary nodules both measuring up to 8 mm IMPRESSION: 1. 5 cm cystic mass in the left lateral neck, most likely a cavitary lymph node and primarily concerning for metastatic disease. An primary mass is not seen, recommend ENT referral for mucosal exam. 2. Two 8 mm pulmonary nodules in the emphysematous right upper lobe, recommend chest CT at follow-up. Electronically Signed   By: Marnee SpringJonathon  Watts M.D.   On: 04/18/2019 13:09    Procedures Procedures (including critical care time)  Medications Ordered in ED Medications - No data to display   Initial Impression / Assessment and Plan / ED Course  I have reviewed the triage vital signs and the nursing notes.  Pertinent labs & imaging results that were  available during my care of the patient were reviewed by me and considered in my medical decision making (see chart for details).        Patient presenting with new mass to the left side of the neck.  He does have a history of tobacco abuse but no other known medical problems.  He has no pain over this mass or signs of infection.  Seems to be an enlarged parotid however he has no tenderness and unlikely to be a salivary stone.  Will ensure no cancerous type lesion.  Will evaluate with a  CT of the neck.  1:32 PM  Patient CT shows a 5 cm cystic mass in the left lateral neck most likely a cavitary lymph node and primarily concerning for metastatic disease.  Also to 8 mm pulmonary nodules and recommend chest CT and follow-up.  Spoke with Dr. Erik Obey with ENT and they will follow-up with the patient for biopsy of the neck and encouraged him to call his regular doctor for follow-up and ongoing evaluation.  Final Clinical Impressions(s) / ED Diagnoses   Final diagnoses:  Mass of left side of neck    ED Discharge Orders    None       Blanchie Dessert, MD 04/18/19 [phone]

## 2019-04-18 NOTE — ED Notes (Signed)
CT awaiting results of BMP prior to imaging per radiology protocol.

## 2019-04-22 DIAGNOSIS — C4442 Squamous cell carcinoma of skin of scalp and neck: Secondary | ICD-10-CM | POA: Diagnosis not present

## 2019-04-22 DIAGNOSIS — F17200 Nicotine dependence, unspecified, uncomplicated: Secondary | ICD-10-CM | POA: Diagnosis not present

## 2019-04-22 DIAGNOSIS — C770 Secondary and unspecified malignant neoplasm of lymph nodes of head, face and neck: Secondary | ICD-10-CM | POA: Diagnosis not present

## 2019-04-22 DIAGNOSIS — R590 Localized enlarged lymph nodes: Secondary | ICD-10-CM | POA: Diagnosis not present

## 2019-05-06 DIAGNOSIS — C4442 Squamous cell carcinoma of skin of scalp and neck: Secondary | ICD-10-CM | POA: Diagnosis not present

## 2019-05-06 DIAGNOSIS — Z0000 Encounter for general adult medical examination without abnormal findings: Secondary | ICD-10-CM | POA: Diagnosis not present

## 2019-05-07 DIAGNOSIS — Z0000 Encounter for general adult medical examination without abnormal findings: Secondary | ICD-10-CM | POA: Diagnosis not present

## 2019-05-07 DIAGNOSIS — Z833 Family history of diabetes mellitus: Secondary | ICD-10-CM | POA: Diagnosis not present

## 2019-05-07 DIAGNOSIS — F17210 Nicotine dependence, cigarettes, uncomplicated: Secondary | ICD-10-CM | POA: Diagnosis not present

## 2019-05-07 DIAGNOSIS — C4442 Squamous cell carcinoma of skin of scalp and neck: Secondary | ICD-10-CM | POA: Diagnosis not present

## 2019-05-17 DIAGNOSIS — R590 Localized enlarged lymph nodes: Secondary | ICD-10-CM | POA: Diagnosis not present

## 2019-05-17 DIAGNOSIS — R918 Other nonspecific abnormal finding of lung field: Secondary | ICD-10-CM | POA: Diagnosis not present

## 2019-05-17 DIAGNOSIS — C760 Malignant neoplasm of head, face and neck: Secondary | ICD-10-CM | POA: Diagnosis not present

## 2019-05-25 DIAGNOSIS — F17210 Nicotine dependence, cigarettes, uncomplicated: Secondary | ICD-10-CM | POA: Diagnosis not present

## 2019-05-25 DIAGNOSIS — C109 Malignant neoplasm of oropharynx, unspecified: Secondary | ICD-10-CM | POA: Diagnosis not present

## 2019-05-25 DIAGNOSIS — R221 Localized swelling, mass and lump, neck: Secondary | ICD-10-CM | POA: Diagnosis not present

## 2019-06-21 DIAGNOSIS — Z01812 Encounter for preprocedural laboratory examination: Secondary | ICD-10-CM | POA: Diagnosis not present

## 2019-06-21 DIAGNOSIS — Z20828 Contact with and (suspected) exposure to other viral communicable diseases: Secondary | ICD-10-CM | POA: Diagnosis not present

## 2019-06-21 DIAGNOSIS — C109 Malignant neoplasm of oropharynx, unspecified: Secondary | ICD-10-CM | POA: Diagnosis not present

## 2019-06-25 DIAGNOSIS — C099 Malignant neoplasm of tonsil, unspecified: Secondary | ICD-10-CM | POA: Diagnosis not present

## 2019-06-25 DIAGNOSIS — C770 Secondary and unspecified malignant neoplasm of lymph nodes of head, face and neck: Secondary | ICD-10-CM | POA: Diagnosis not present

## 2020-03-03 IMAGING — CT CT NECK WITH CONTRAST
4 series · 15 of 33 positions shown, 18 images · IV contrast (omnipaque)
Comparison: None.

CLINICAL DATA: Neck mass, solitary, afebrile. Mass noted on [REDACTED].

EXAM:
CT NECK WITH CONTRAST
TECHNIQUE: Multidetector CT imaging of the neck was performed using the
standard protocol following the bolus administration of intravenous
contrast.
CONTRAST:  75mL OMNIPAQUE IOHEXOL 300 MG/ML  SOLN

[Series 3: axial neck · axial · 0.69mm/px · z∈[+492,+700]mm · 5 of 157 slices shown, 7 images]
[im 27/157  soft-tissue]
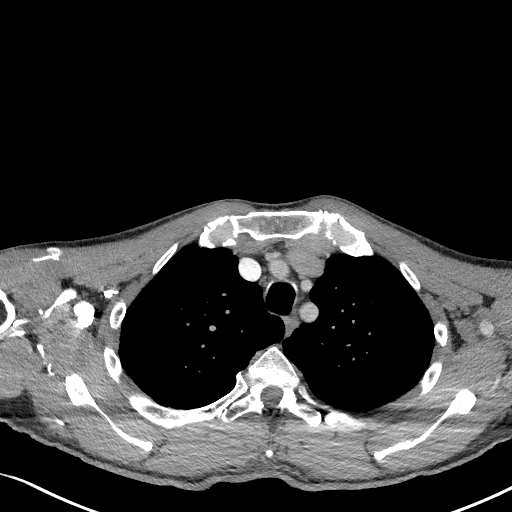
[im 27/157  bone]
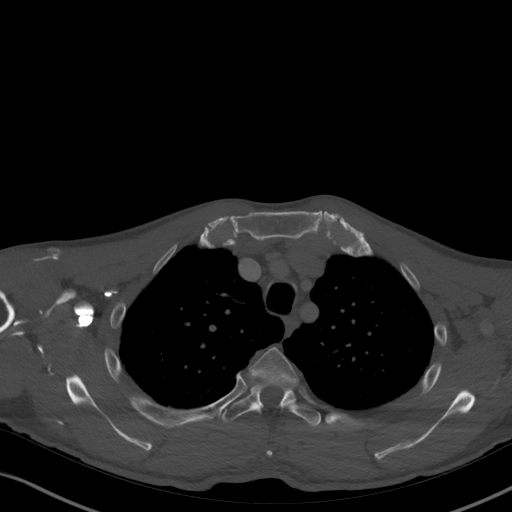
[im 53/157  bone]
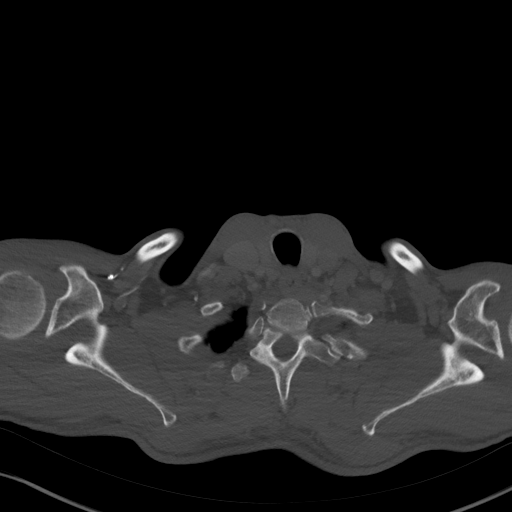
[im 79/157  bone]
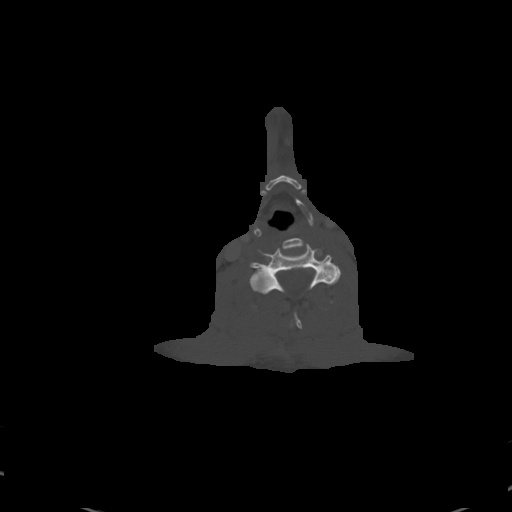
[im 105/157  bone]
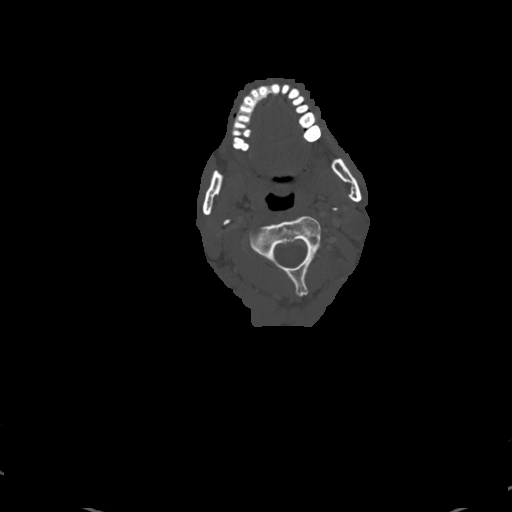
[im 131/157  soft-tissue]
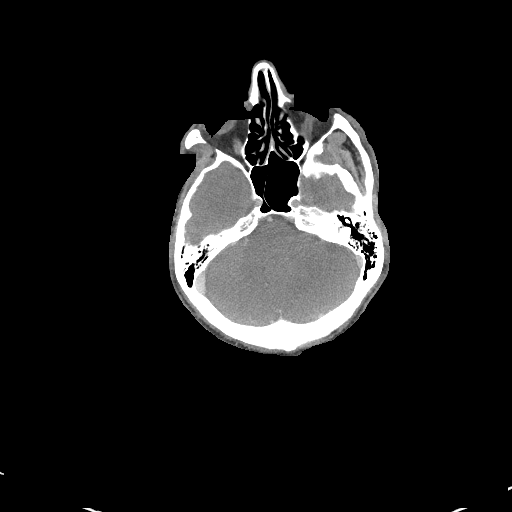
[im 131/157  bone]
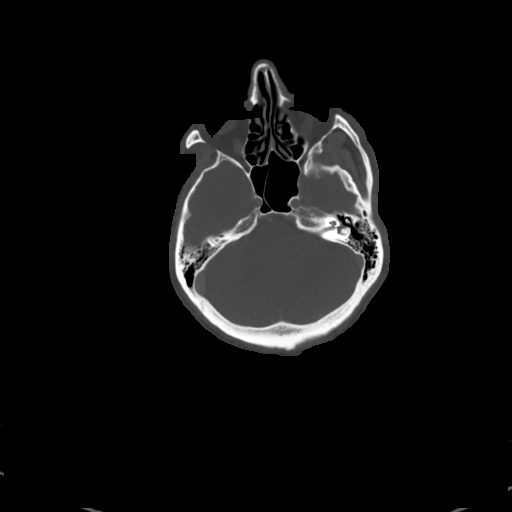

[Series 6: sag neck · sagittal · 0.72mm/px · 5 of 135 slices shown, 6 images]
[im 45/135  bone]
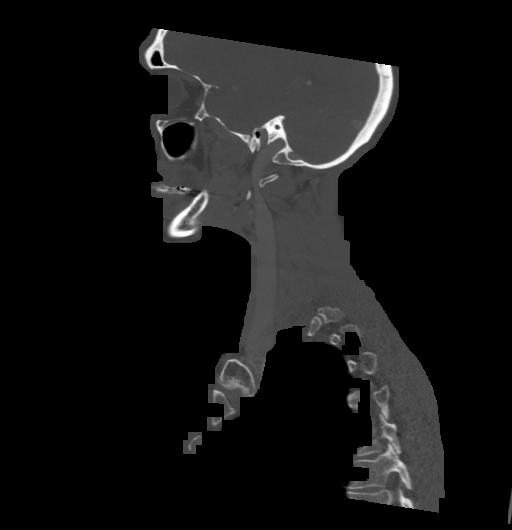
[im 56/135  bone]
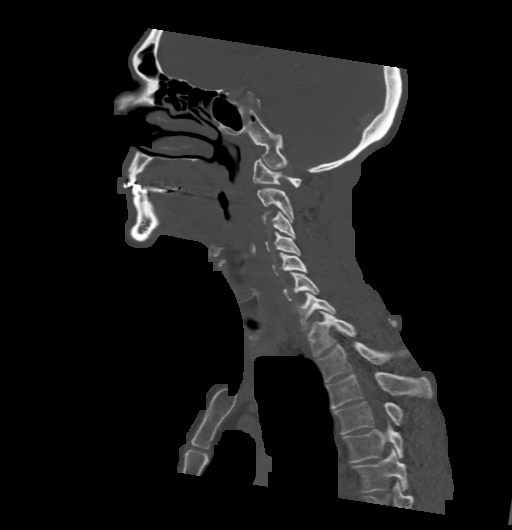
[im 68/135  soft-tissue]
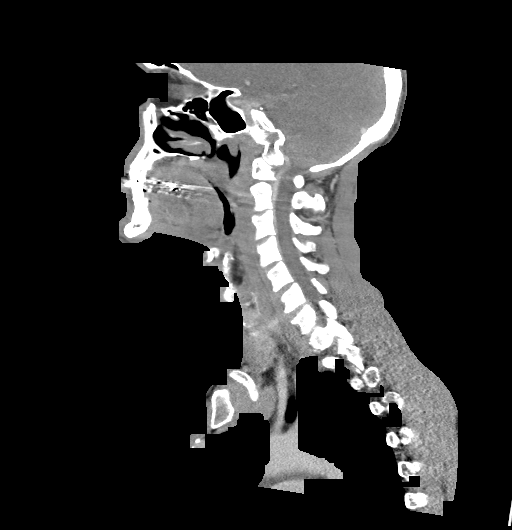
[im 68/135  bone]
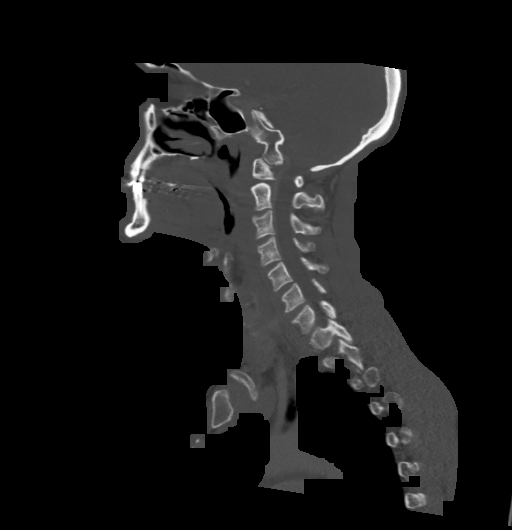
[im 79/135  bone]
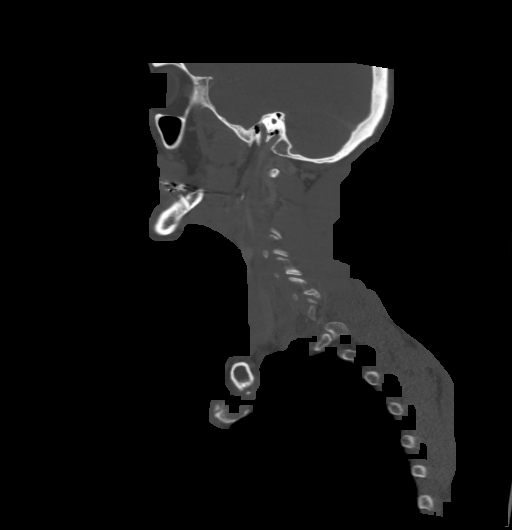
[im 90/135  bone]
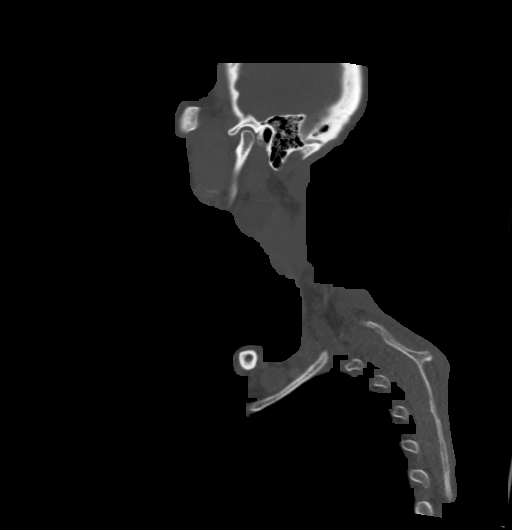

[Series 7: cor neck · coronal · 0.58mm/px · 3 of 115 slices shown]
[im 31/115  bone]
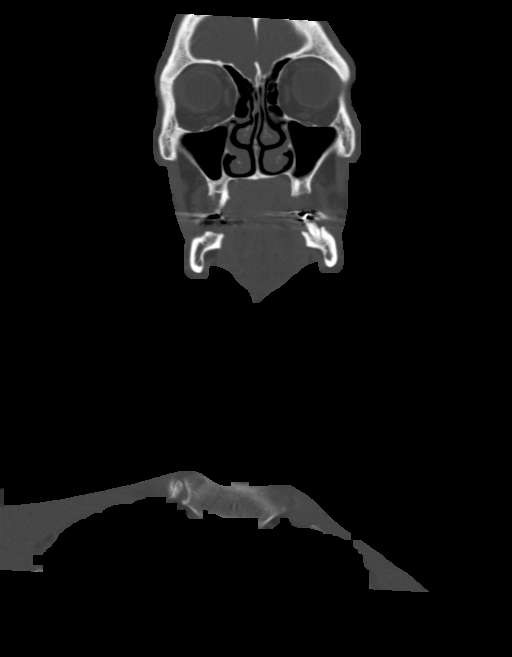
[im 49/115  bone]
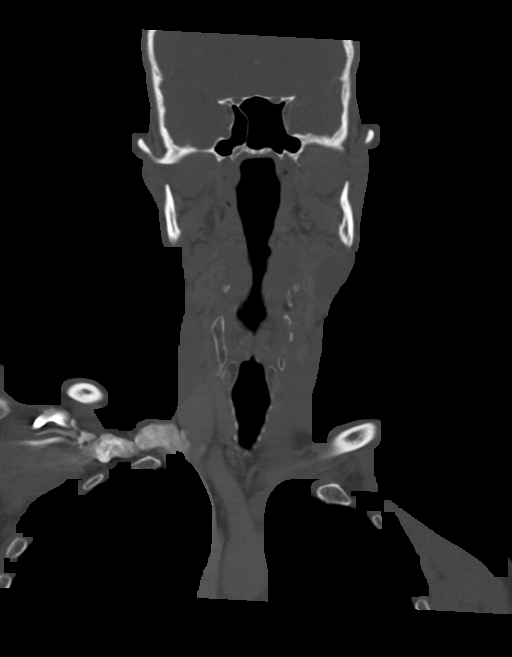
[im 67/115  bone]
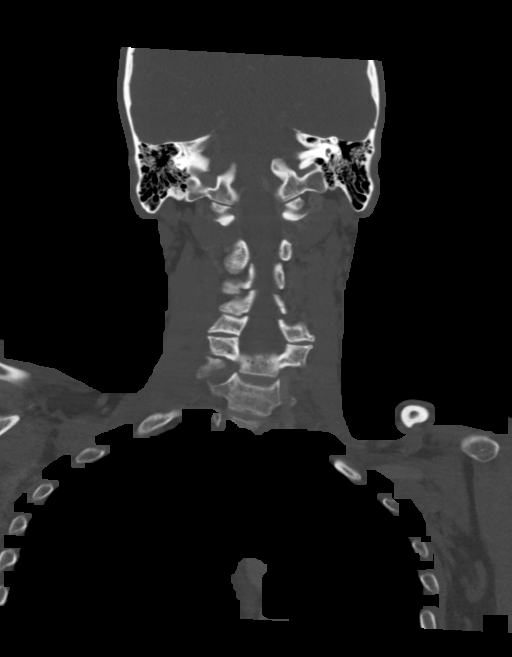

[Series 8: orthogonal ax · axial · 0.45mm/px · z∈[+444,+498]mm · 2 of 166 slices shown]
[im 28/166  bone]
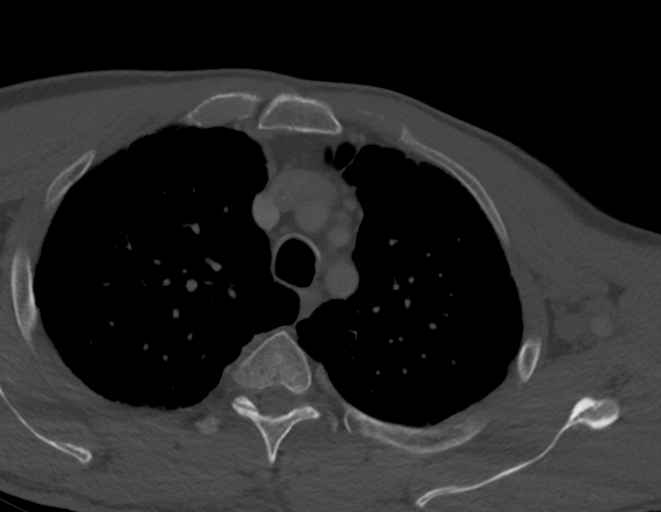
[im 56/166  bone]
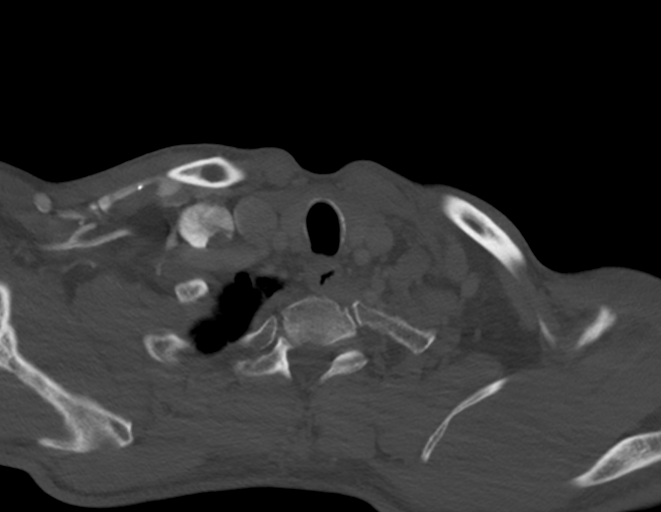

[15 of 33 positions shown; findings below may reference images not displayed]

FINDINGS: Pharynx and larynx: No mass or swelling.

Salivary glands: No inflammation, mass, or stone.

Thyroid: Normal.

Lymph nodes: Cystic mass with peripheral soft tissue density and
incomplete septations, 5 cm maximal span and located in the left
jugulodigastric region. This is most likely cystic lymphadenopathy.
The neighboring fat is not inflamed. No additional enlarged lymph
node.

Vascular: Extrinsic compression of the left IJ due to the above.

Limited intracranial: Negative

Visualized orbits: Negative

Mastoids and visualized paranasal sinuses: Clear

Skeleton: No acute or destructive changes.

Upper chest: Emphysema. Two right apical pulmonary nodules both
measuring up to 8 mm
IMPRESSION: 1. 5 cm cystic mass in the left lateral neck, most likely a cavitary
lymph node and primarily concerning for metastatic disease. An
primary mass is not seen, recommend ENT referral for mucosal exam.
2. Two 8 mm pulmonary nodules in the emphysematous right upper lobe,
recommend chest CT at follow-up.

## 2021-06-08 ENCOUNTER — Emergency Department (HOSPITAL_COMMUNITY)
Admission: EM | Admit: 2021-06-08 | Discharge: 2021-06-08 | Discharge status: Against Medical Advice | Payer: BC Managed Care – PPO

## 2021-06-08 NOTE — ED Notes (Signed)
Pt left AMA due to wait time  

## 2021-06-10 ENCOUNTER — Emergency Department (HOSPITAL_BASED_OUTPATIENT_CLINIC_OR_DEPARTMENT_OTHER): Payer: BC Managed Care – PPO

## 2021-06-10 ENCOUNTER — Encounter (HOSPITAL_BASED_OUTPATIENT_CLINIC_OR_DEPARTMENT_OTHER): Payer: Self-pay | Admitting: *Deleted

## 2021-06-10 ENCOUNTER — Emergency Department (HOSPITAL_BASED_OUTPATIENT_CLINIC_OR_DEPARTMENT_OTHER)
Admission: EM | Admit: 2021-06-10 | Discharge: 2021-06-10 | Disposition: A | Discharge status: Home / Self Care | Payer: BC Managed Care – PPO | Attending: Emergency Medicine | Admitting: Emergency Medicine

## 2021-06-10 ENCOUNTER — Other Ambulatory Visit: Payer: Self-pay

## 2021-06-10 DIAGNOSIS — J069 Acute upper respiratory infection, unspecified: Secondary | ICD-10-CM

## 2021-06-10 DIAGNOSIS — F17210 Nicotine dependence, cigarettes, uncomplicated: Secondary | ICD-10-CM | POA: Diagnosis not present

## 2021-06-10 DIAGNOSIS — R059 Cough, unspecified: Secondary | ICD-10-CM | POA: Diagnosis present

## 2021-06-10 DIAGNOSIS — Z20822 Contact with and (suspected) exposure to covid-19: Secondary | ICD-10-CM | POA: Insufficient documentation

## 2021-06-10 LAB — RESP PANEL BY RT-PCR (FLU A&B, COVID) ARPGX2
Influenza A by PCR: NEGATIVE
Influenza B by PCR: NEGATIVE
SARS Coronavirus 2 by RT PCR: NEGATIVE

## 2021-06-10 MED ORDER — IPRATROPIUM BROMIDE HFA 17 MCG/ACT IN AERS: 2.0000 | INHALATION_SPRAY | Freq: Once | RESPIRATORY_TRACT | Status: DC

## 2021-06-10 MED ORDER — IPRATROPIUM-ALBUTEROL 20-100 MCG/ACT IN AERS
1.0000 | INHALATION_SPRAY | Freq: Four times a day (QID) | RESPIRATORY_TRACT | Status: DC
  Administered 2021-06-10: 1 via RESPIRATORY_TRACT
  Filled 2021-06-10: qty 4

## 2021-06-10 MED ORDER — ALBUTEROL SULFATE HFA 108 (90 BASE) MCG/ACT IN AERS: 2.0000 | INHALATION_SPRAY | Freq: Once | RESPIRATORY_TRACT | Status: DC

## 2021-06-10 NOTE — Discharge Instructions (Addendum)
Your COVID and flu swabs today were negative.  I suspect you have another viral infection, unfortunately no antibiotics are warranted for viral illnesses.  You may use your Combivent inhaler as needed every 6 hours.  I also recommend you use OTC Flonase, Cepacol, Zyrtec for continued symptom management.  You may also alternate use of Tylenol and ibuprofen as needed for pain and fever control.

## 2021-06-10 NOTE — ED Notes (Signed)
Patient Alert and oriented to baseline. Stable and ambulatory to baseline. Patient verbalized understanding of the discharge instructions.  Patient belongings were taken by the patient.   

## 2021-06-10 NOTE — ED Provider Notes (Signed)
[facility] EMERGENCY DEPARTMENT Provider Note   CSN: [id number] Arrival date & time: 06/10/21  1402     History Chief Complaint  Patient presents with   Cough   Fever    Edward Hoopes. is a 60 y.o. male.  Patient presents today with cough and nasal congestion.  Patient states that she has been experiencing these symptoms since Monday.  States that the cough is worse in the morning, and productive of yellow sputum.  Denies any sick contacts.  Has been taking Mucinex and Tylenol for symptoms without relief.  Denies any recorded fevers.  Of note, patient is a chronic heavy smoker with a history of throat cancer that was excised in 2020.  The history is provided by the patient. No language interpreter was used.  Cough Associated symptoms: rhinorrhea, sore throat and wheezing   Associated symptoms: no chills, no ear pain, no fever, no rash and no shortness of breath   Fever Associated symptoms: congestion, cough, rhinorrhea and sore throat   Associated symptoms: no chills, no confusion, no ear pain and no rash       Past Medical History:  Diagnosis Date   Hernia     Patient Active Problem List   Diagnosis Date Noted   Tobacco abuse 03/12/2013    Past Surgical History:  Procedure Laterality Date   HERNIA REPAIR  03/2013       History reviewed. No pertinent family history.  Social History   Tobacco Use   Smoking status: Every Day    Packs/day: 2.00    Years: 31.00    Pack years: 62.00    Types: Cigarettes   Smokeless tobacco: Never  Substance Use Topics   Alcohol use: No    Alcohol/week: 0.0 standard drinks   Drug use: No    Home Medications Prior to Admission medications   Not on File    Allergies    Patient has no known allergies.  Review of Systems   Review of Systems  Constitutional:  Negative for chills and fever.  HENT:  Positive for congestion, postnasal drip, rhinorrhea and sore throat. Negative for drooling, ear discharge, ear  pain, facial swelling, mouth sores, nosebleeds, sinus pressure, sinus pain, sneezing, trouble swallowing and voice change.   Eyes:  Negative for pain and itching.  Respiratory:  Positive for cough and wheezing. Negative for apnea, chest tightness, shortness of breath and stridor.   Skin:  Negative for color change, pallor and rash.  Psychiatric/Behavioral:  Negative for confusion and decreased concentration.   All other systems reviewed and are negative.  Physical Exam Updated Vital Signs BP 138/84 (BP Location: Left Arm)   Pulse 72   Temp 98.4 F (36.9 C) (Oral)   Resp 19   Ht 5\' 9"  (1.753 m)   Wt 68 kg   SpO2 95%   BMI 22.15 kg/m   Physical Exam Vitals and nursing note reviewed.  Constitutional:      General: He is not in acute distress.    Appearance: Normal appearance. He is normal weight. He is not ill-appearing, toxic-appearing or diaphoretic.  HENT:     Head: Normocephalic.     Nose: Congestion and rhinorrhea present.     Mouth/Throat:     Mouth: Mucous membranes are moist.     Pharynx: Oropharynx is clear. No oropharyngeal exudate or posterior oropharyngeal erythema.  Eyes:     General: No scleral icterus.    Conjunctiva/sclera: Conjunctivae normal.  Cardiovascular:  Rate and Rhythm: Normal rate and regular rhythm.     Heart sounds: Normal heart sounds. No murmur heard. Pulmonary:     Effort: Pulmonary effort is normal. No respiratory distress.     Breath sounds: No stridor. Examination of the right-upper field reveals wheezing. Examination of the left-upper field reveals wheezing. Examination of the right-middle field reveals wheezing. Examination of the left-middle field reveals wheezing. Examination of the right-lower field reveals wheezing. Examination of the left-lower field reveals wheezing. Wheezing present. No decreased breath sounds, rhonchi or rales.     Comments: Exertional wheezing noted throughout all lung fields on initial evaluation.    Following  inhaler use, wheezing has significantly diminished and lung sounds are now clear. Abdominal:     General: Abdomen is flat.  Musculoskeletal:     Cervical back: Normal range of motion.  Skin:    General: Skin is warm and dry.  Neurological:     General: No focal deficit present.     Mental Status: He is alert.     Motor: No weakness.  Psychiatric:        Mood and Affect: Mood normal.        Behavior: Behavior normal.    ED Results / Procedures / Treatments   Labs (all labs ordered are listed, but only abnormal results are displayed) Labs Reviewed  RESP PANEL BY RT-PCR (FLU A&B, COVID) [id number]    EKG None  Radiology DG Chest Portable 1 View  Result Date: 06/10/2021 CLINICAL DATA:  Cough EXAM: PORTABLE CHEST 1 VIEW COMPARISON:  Chest x-ray 09/30/2017. FINDINGS: The heart size and mediastinal contours are within normal limits. Both lungs are clear. The visualized skeletal structures are unremarkable. IMPRESSION: No active disease. Electronically Signed   By: Darliss Cheney M.D.   On: 06/10/2021 15:45    Procedures Procedures   Medications Ordered in ED Medications  ipratropium (ATROVENT HFA) inhaler 2 puff (has no administration in time range)  albuterol (VENTOLIN HFA) 108 (90 Base) MCG/ACT inhaler 2 puff (has no administration in time range)    ED Course  I have reviewed the triage vital signs and the nursing notes.  Pertinent labs & imaging results that were available during my care of the patient were reviewed by me and considered in my medical decision making (see chart for details).    MDM Rules/Calculators/A&P                         Patient presents with cough and congestion for 6 days.  COVID, flu swabs both negative.  Pt CXR negative for acute infiltrate.  Some wheezing noted throughout lung fields on initial exam, ordered Combivent inhaler for same.  On reassessment following inhaler use, patient's lungs are now clear.  Patient states that he feels much better  after inhaler use.  Lung sounds could be associated with patient's long-term smoking, mentioned same to patient and recommended that he follow-up with his primary care for further evaluation of this.    Patients symptoms are consistent with URI, likely viral etiology. Discussed that antibiotics are not indicated for viral infections. Pt will be discharged with symptomatic treatment.  Verbalizes understanding and is agreeable with plan. Pt is hemodynamically stable & in NAD prior to dc.   Final Clinical Impression(s) / ED Diagnoses Final diagnoses:  Viral upper respiratory tract infection    Rx / DC Orders ED Discharge Orders     None     An After Visit  Summary was printed and given to the patient.    Vear Clock 06/10/21 1714    Pollyann Savoy, MD 06/11/21 [phone]

## 2021-06-10 NOTE — ED Triage Notes (Signed)
Coughing for a week and intermittent fever.

## 2022-01-06 ENCOUNTER — Other Ambulatory Visit: Payer: Self-pay

## 2022-01-06 ENCOUNTER — Emergency Department (HOSPITAL_BASED_OUTPATIENT_CLINIC_OR_DEPARTMENT_OTHER)
Admission: EM | Admit: 2022-01-06 | Discharge: 2022-01-06 | Disposition: A | Discharge status: Home / Self Care | Payer: BC Managed Care – PPO | Attending: Emergency Medicine | Admitting: Emergency Medicine

## 2022-01-06 ENCOUNTER — Encounter (HOSPITAL_BASED_OUTPATIENT_CLINIC_OR_DEPARTMENT_OTHER): Payer: Self-pay | Admitting: Urology

## 2022-01-06 DIAGNOSIS — S61236A Puncture wound without foreign body of right little finger without damage to nail, initial encounter: Secondary | ICD-10-CM | POA: Diagnosis not present

## 2022-01-06 DIAGNOSIS — Z23 Encounter for immunization: Secondary | ICD-10-CM | POA: Diagnosis not present

## 2022-01-06 DIAGNOSIS — W5501XA Bitten by cat, initial encounter: Secondary | ICD-10-CM | POA: Diagnosis not present

## 2022-01-06 DIAGNOSIS — S50312A Abrasion of left elbow, initial encounter: Secondary | ICD-10-CM | POA: Insufficient documentation

## 2022-01-06 DIAGNOSIS — S60946A Unspecified superficial injury of right little finger, initial encounter: Secondary | ICD-10-CM | POA: Diagnosis present

## 2022-01-06 MED ORDER — TETANUS-DIPHTH-ACELL PERTUSSIS 5-2.5-18.5 LF-MCG/0.5 IM SUSY
0.5000 mL | PREFILLED_SYRINGE | Freq: Once | INTRAMUSCULAR | Status: AC
  Administered 2022-01-06: 0.5 mL via INTRAMUSCULAR
  Filled 2022-01-06: qty 0.5

## 2022-01-06 MED ORDER — AMOXICILLIN-POT CLAVULANATE 875-125 MG PO TABS
1.0000 | ORAL_TABLET | Freq: Once | ORAL | Status: AC
  Administered 2022-01-06: 1 via ORAL
  Filled 2022-01-06: qty 1

## 2022-01-06 MED ORDER — AMOXICILLIN-POT CLAVULANATE 875-125 MG PO TABS: 1.0000 | ORAL_TABLET | Freq: Two times a day (BID) | ORAL | 0 refills | Status: AC

## 2022-01-06 NOTE — ED Provider Notes (Signed)
?[facility] EMERGENCY DEPARTMENT ?Provider Note ? ? ?CSN: [id number] ?Arrival date & time: 01/06/22  1552 ? ?  ? ?History ? ?Chief Complaint  ?Patient presents with  ? Animal Bite  ? ? ?Edward Mcguire. is a 61 y.o. male who presents to the ED for evaluation of cat bite and scratch that occurred yesterday with his vaccinated.  Cat was able to scratch his left elbow and then bite his right pinky finger.  He cleaned the wound with hydrogen peroxide, soap and water and then has been placing Neosporin on it however he is noted some swelling increasing to the right pinky, making it difficult for him to bend the finger.  He also has some swelling to the left elbow around where the cat had scratched.  Patient denies fever, chills.  He denies numbness and tingling.  He has no other systemic complaints. ? ? ?Animal Bite ? ?  ? ?Home Medications ?Prior to Admission medications   ?Medication Sig Start Date End Date Taking? Authorizing Provider  ?amoxicillin-clavulanate (AUGMENTIN) 875-125 MG tablet Take 1 tablet by mouth every 12 (twelve) hours for 10 days. 01/06/22 01/16/22 Yes Janell Quiet, PA-C  ?   ? ?Allergies    ?Patient has no known allergies.   ? ?Review of Systems   ?Review of Systems ? ?Physical Exam ?Updated Vital Signs ?BP (!) 147/82 (BP Location: Right Arm)   Pulse 69   Temp 98 ?F (36.7 ?C) (Oral)   Resp 18   Ht 5\' 9"  (1.753 m)   Wt 68 kg   SpO2 95%   BMI 22.15 kg/m?  ?Physical Exam ?Vitals and nursing note reviewed.  ?Constitutional:   ?   General: He is not in acute distress. ?   Appearance: He is not ill-appearing.  ?HENT:  ?   Head: Atraumatic.  ?Eyes:  ?   Conjunctiva/sclera: Conjunctivae normal.  ?Cardiovascular:  ?   Rate and Rhythm: Normal rate and regular rhythm.  ?   Pulses: Normal pulses.  ?   Heart sounds: No murmur heard. ?Pulmonary:  ?   Effort: Pulmonary effort is normal. No respiratory distress.  ?   Breath sounds: Normal breath sounds.  ?Abdominal:  ?   General: Abdomen is  flat. There is no distension.  ?   Palpations: Abdomen is soft.  ?   Tenderness: There is no abdominal tenderness.  ?Musculoskeletal:     ?   General: Normal range of motion.  ?   Cervical back: Normal range of motion.  ?   Comments: Multiple shallow cat scratches to left elbow with mild erythema and tenderness to palpation; ROM intact ? ?Right pinky finger with 2 puncture wounds. Right pinky swollen, erythematous. ROM on active flexion limited d/t swelling. Sensation intact  ?Skin: ?   General: Skin is warm and dry.  ?   Capillary Refill: Capillary refill takes less than 2 seconds.  ?Neurological:  ?   General: No focal deficit present.  ?   Mental Status: He is alert.  ?Psychiatric:     ?   Mood and Affect: Mood normal.  ? ? ?ED Results / Procedures / Treatments   ?Labs ?(all labs ordered are listed, but only abnormal results are displayed) ?Labs Reviewed - No data to display ? ?EKG ?None ? ?Radiology ?No results found. ? ?Procedures ?Procedures  ? ? ?Medications Ordered in ED ?Medications  ?amoxicillin-clavulanate (AUGMENTIN) 875-125 MG per tablet 1 tablet (1 tablet Oral Given 01/06/22 1625)  ?Tdap (  BOOSTRIX) injection 0.5 mL (0.5 mLs Intramuscular Given 01/06/22 1629)  ? ? ?ED Course/ Medical Decision Making/ A&P ?  ?                        ?Medical Decision Making ?Risk ?Prescription drug management. ? ?61 year old male in no acute distress, nontoxic-appearing presents to the ED for multiple cat scratches and cat bite on his right hand.  Vitals are normal.  Physical exam significant for shallow Scratches to the left elbow with some erythema and tenderness without acute swelling.  ROM of joint intact.  Concern for septic joint is limited.  Right pinky finger with 2 puncture wounds and swollen, erythematous with limited ROM due to swelling.  Indicative of early signs of infection.  Patient given first dose of Augmentin here in the emergency department and discharged home with 10 days of medication.  Advised on  signs and symptoms that warrant immediate return to the ED.  Indicated low threshold for return.  Patient expresses understanding and is amenable to plan.  Discharged home in good condition. ? ?Final Clinical Impression(s) / ED Diagnoses ?Final diagnoses:  ?Cat bite, initial encounter  ? ? ?Rx / DC Orders ?ED Discharge Orders   ? ?      Ordered  ?  amoxicillin-clavulanate (AUGMENTIN) 875-125 MG tablet  Every 12 hours       ? 01/06/22 1631  ? ?  ?  ? ?  ? ? ?  ?Janell Quiet, [place] ?01/06/22 1753 ? ?  ?Charlynne Pander, MD ?01/06/22 2248 ? ?

## 2022-01-06 NOTE — Discharge Instructions (Addendum)
The cat bite on your finger appears to be getting infected. I have sent you in with a prescription of antibiotic that you need to take for the next 10 days.  If at any point the infection seems to worsening instead of improving or you develop fevers, please return to the ED for evaluation. Continue to keep the area clean and dry.  ?

## 2022-01-06 NOTE — ED Triage Notes (Signed)
Multiple cat scratches to bilateral forearms and bite to right pinky finger Saturday  ?Pt states it was his cat, cat is vaccinated ?Sweling and redness noted to finger and left elbow ? ?

## 2022-04-26 IMAGING — DX DG CHEST 1V PORT
1 series · 1 of 1 positions shown · non-contrast
Comparison: Chest x-ray 09/30/2017.

CLINICAL DATA: Cough

EXAM:
PORTABLE CHEST 1 VIEW

[chest ap]
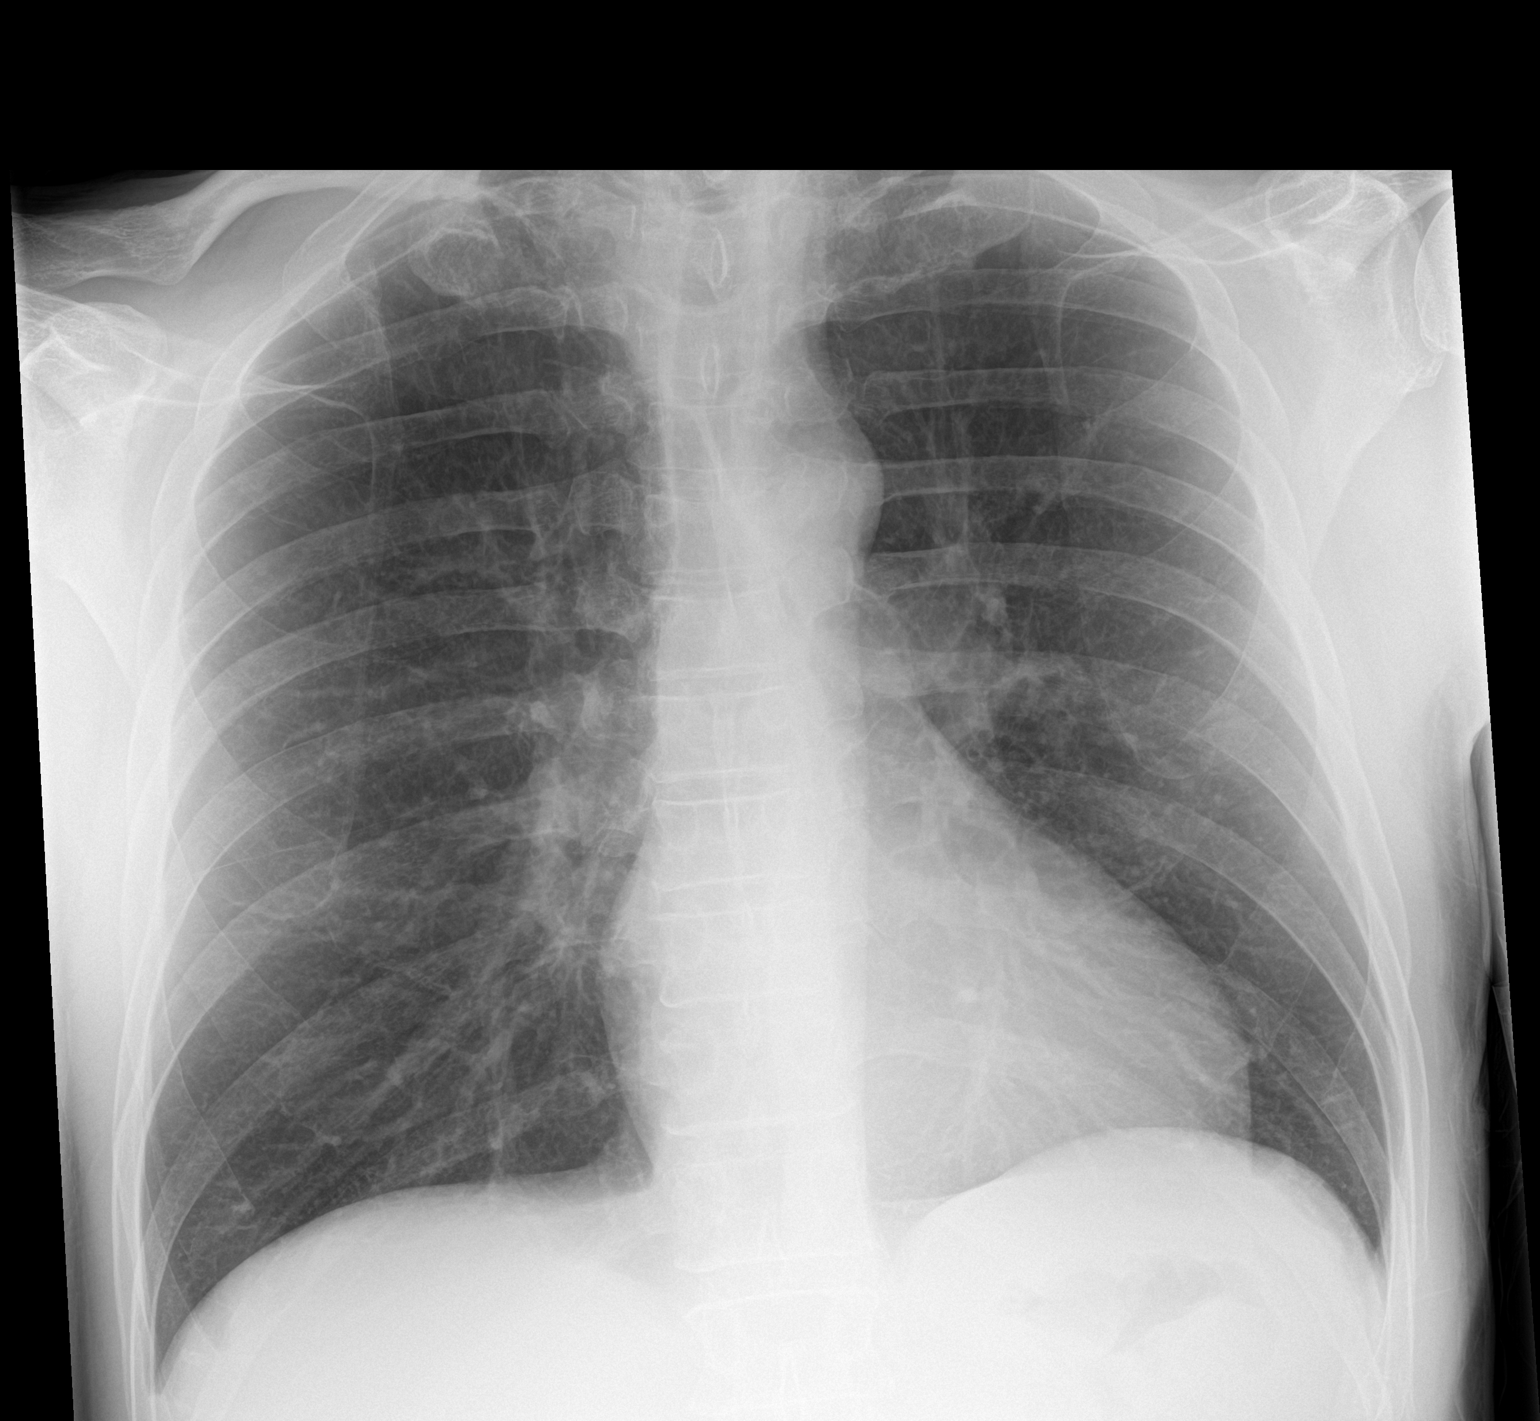

[1 of 1 positions shown; findings below may reference images not displayed]

FINDINGS: The heart size and mediastinal contours are within normal limits.
Both lungs are clear. The visualized skeletal structures are
unremarkable.
IMPRESSION: No active disease.
